# Patient Record
Sex: Male | Born: 1970 | Race: White | Hispanic: No | Marital: Married | Smoking: Never smoker
Health system: Southern US, Community
[De-identification: ages and names within clinical notes are randomized; demographics above are authoritative.]

## PROBLEM LIST (undated history)

## (undated) DIAGNOSIS — K900 Celiac disease: Secondary | ICD-10-CM

## (undated) DIAGNOSIS — I10 Essential (primary) hypertension: Secondary | ICD-10-CM

## (undated) DIAGNOSIS — E785 Hyperlipidemia, unspecified: Secondary | ICD-10-CM

## (undated) DIAGNOSIS — K311 Adult hypertrophic pyloric stenosis: Secondary | ICD-10-CM

## (undated) DIAGNOSIS — J9383 Other pneumothorax: Secondary | ICD-10-CM

## (undated) DIAGNOSIS — T7840XA Allergy, unspecified, initial encounter: Secondary | ICD-10-CM

## (undated) DIAGNOSIS — E739 Lactose intolerance, unspecified: Secondary | ICD-10-CM

## (undated) DIAGNOSIS — I341 Nonrheumatic mitral (valve) prolapse: Secondary | ICD-10-CM

## (undated) HISTORY — DX: Nonrheumatic mitral (valve) prolapse: I34.1

## (undated) HISTORY — DX: Other pneumothorax: J93.83

## (undated) HISTORY — DX: Allergy, unspecified, initial encounter: T78.40XA

## (undated) HISTORY — DX: Essential (primary) hypertension: I10

## (undated) HISTORY — PX: UPPER GASTROINTESTINAL ENDOSCOPY: SHX188

## (undated) HISTORY — DX: Adult hypertrophic pyloric stenosis: K31.1

## (undated) HISTORY — PX: BIOPSY STOMACH: PRO33

## (undated) HISTORY — DX: Celiac disease: K90.0

## (undated) HISTORY — PX: OTHER SURGICAL HISTORY: SHX169

## (undated) HISTORY — DX: Hyperlipidemia, unspecified: E78.5

## (undated) HISTORY — DX: Lactose intolerance, unspecified: E73.9

---

## 2000-04-23 ENCOUNTER — Ambulatory Visit (HOSPITAL_COMMUNITY): Admission: RE | Admit: 2000-04-23 | Discharge: 2000-04-23 | Payer: Self-pay | Admitting: Internal Medicine

## 2001-05-18 ENCOUNTER — Emergency Department (HOSPITAL_COMMUNITY): Admission: EM | Admit: 2001-05-18 | Discharge: 2001-05-19 | Payer: Self-pay

## 2004-10-16 ENCOUNTER — Emergency Department (HOSPITAL_COMMUNITY): Admission: EM | Admit: 2004-10-16 | Discharge: 2004-10-16 | Payer: Self-pay | Admitting: Emergency Medicine

## 2007-08-16 ENCOUNTER — Ambulatory Visit: Payer: Self-pay | Admitting: Cardiology

## 2007-08-23 ENCOUNTER — Ambulatory Visit: Payer: Self-pay

## 2010-12-13 NOTE — Assessment & Plan Note (Signed)
[facility]                            CARDIOLOGY OFFICE NOTE   NAME:Harry Molina, Harry Molina                    MRN:          [id number]  DATE:08/16/2007                            DOB:          [birth year]    REASON FOR REFERRAL:  Positive risk factors for coronary heart disease  and symptoms of dyspnea.   CLINICAL HISTORY:  Chriss Mannan is known to me through his father,  Karlo Goeden, who is a patient of mine.  He has had no prior history  of known heart disease.  He does have significant risk factors for  coronary heart disease with hypertension and hyperlipidemia which was  recently treated with Zocor beginning 2 months ago.  He has had no chest  pain, but he has had some intermittent shortness of breath with exertion  with various physical activities.  This has been intermittent and he is  not sure this has really increased in severity.  He was concerned  because of his markedly positive family history and this precipitated  the referral.   PAST MEDICAL HISTORY:  1. 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  2. Pyloric stenosis requiring surgery when he was a child.   FAMILY HISTORY:  Positive for heart disease in that his father had a  heart attack in his 32s.  He has a paternal grandfather and paternal  uncles who have had heart attacks and has a brother who has diabetes.  His brother is a physician and his uncle is a Development worker, community.   SOCIAL HISTORY:  He is a married and has two children with another one  on the way.  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   REVIEW OF SYSTEMS:  Positive for some epigastric discomfort and dyspnea.  Review of systems is otherwise negative.   PHYSICAL EXAMINATION:  VITAL SIGNS:  Blood pressure 119/70 with pulse 76  and regular.  NECK:  There was no venous distention.  The  carotid pulses were full  without bruits.  CHEST:  Clear without rales or rhonchi.  HEART:  Rhythm was regular.  I could hear no murmurs, gallops or clicks.  ABDOMEN:  Soft with normal bowel sounds.  There is no  hepatosplenomegaly.  Peripheral pulses are full and there is no  peripheral edema.   LABORATORY DATA AND X-RAY FINDINGS:  Electrocardiogram was normal.   He brought his liver panel with him and his total cholesterol was 231,  HDL was 46, LDL 165, pre-Zocor.  He also had a panel which showed that  his small particle number was very high and his LDL particle size was  quite small.   IMPRESSION:  1. Intermittent dyspnea of uncertain etiology.  2. Hyperlipidemia with small particle LDL size.  3. Hypertension treated.   RECOMMENDATIONS:  Matt's risk factors are quite strong with a very  strong positive family history of coronary heart disease and an abnormal  lipid profile and hypertension.  He also has symptoms  of dyspnea and I  think we should evaluate this with an exercise rest stress Myoview scan.  He has already been started on treatment for his hyperlipidemia and his  blood pressure appears to be under good control with the Benicar.  Dr.  Felipa Eth will follow up on his lipid profile.  If his scan is negative, the  we will reassure him that things are okay now and encourage him to  continue preventive measures with Dr. Felipa Eth.  We will be in touch with  him after we have the results.  We will have him return on a p.r.n.  basis.     Bruce Elvera Lennox Juanda Chance, MD, [facility]  Electronically Signed    BRB/MedQ  DD: 08/16/2007  DT: 08/16/2007  Job #: [id number]   cc:   Larina Earthly, M.D.

## 2011-01-25 ENCOUNTER — Telehealth: Payer: Self-pay | Admitting: *Deleted

## 2011-01-25 NOTE — Telephone Encounter (Signed)
Wrong chart

## 2015-04-13 ENCOUNTER — Ambulatory Visit (INDEPENDENT_AMBULATORY_CARE_PROVIDER_SITE_OTHER): Payer: 59

## 2015-04-13 ENCOUNTER — Ambulatory Visit (INDEPENDENT_AMBULATORY_CARE_PROVIDER_SITE_OTHER): Payer: 59 | Admitting: Family Medicine

## 2015-04-13 VITALS — BP 110/64 | HR 73 | Temp 98.1°F | Resp 18 | Ht 72.75 in | Wt 182.6 lb

## 2015-04-13 DIAGNOSIS — M79671 Pain in right foot: Secondary | ICD-10-CM

## 2015-04-13 DIAGNOSIS — S9031XA Contusion of right foot, initial encounter: Secondary | ICD-10-CM | POA: Diagnosis not present

## 2015-04-13 MED ORDER — MELOXICAM 15 MG PO TABS: 7.5000 mg | ORAL_TABLET | Freq: Every day | ORAL | Status: DC

## 2015-04-13 NOTE — Progress Notes (Signed)
    MRN: [id number] DOB: August 22, 1970  Subjective:   Harry Molina is a 44 y.o. male presenting for chief complaint of Foot Injury  Reports 1.5 week history of bilateral heel pain, R>L. Pain started after jumping into a creek from about 10-12 feet high into creek that was not very deep. Since then, he has had persistent heel pain, walks with a limp or on his tiptoes. He has not tried any medications for relief, has not been able to rest and walks a lot for his work. Denies fever, bruising, redness, hearing popping or tearing noise is, swelling. Patient's primary concern is to make sure that he does not have a heel fracture. Denies any other aggravating or relieving factors, no other questions or concerns.  Harry Molina has a current medication list which includes the following prescription(s): olmesartan, rosuvastatin, and meloxicam. Also has No Known Allergies.  Harry Molina  has a past medical history of Hyperlipidemia and Hypertension. Also  has no past surgical history on file.  Objective:   Vitals: BP 110/64 mmHg  Pulse 73  Temp(Src) 98.1 F (36.7 C) (Oral)  Resp 18  Ht 6' 0.75" (1.848 m)  Wt 182 lb 9.6 oz (82.827 kg)  BMI 24.25 kg/m2  SpO2 99%  Physical Exam  Constitutional: He is oriented to person, place, and time. He appears well-developed and well-nourished.  Cardiovascular: Normal rate.   Pulmonary/Chest: Effort normal.  Musculoskeletal:       Right foot: There is normal range of motion, no tenderness, no bony tenderness, no swelling, normal capillary refill, no crepitus, no deformity and no laceration.  No ecchymosis.  Neurological: He is alert and oriented to person, place, and time. He has normal reflexes.  Skin: Skin is warm and dry. No rash noted. No erythema. No pallor.  Psychiatric: He has a normal mood and affect.   [facility] reading (PRIMARY) by  Dr. Neva Seat and PA-Carmel Garfield. Right foot - normal.  Assessment and Plan :   1. Right foot pain 2. Heel pain, right 3. Contusion  of heel, right, initial encounter - Patient likely has right heel contusion, recommended supportive care, start meloxicam and use ice bath after work. Advised patient that he should pick up a heel support since he is unable to rest and walks a lot for his job. If the patient has no improvement in next 2 weeks he is to return to clinic and we'll consider referral to or through or physical therapy.  Wallis Bamberg, PA-C [facility] [phone] 04/13/2015 3:11 PM

## 2015-04-13 NOTE — Patient Instructions (Addendum)
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

## 2015-04-14 NOTE — Progress Notes (Signed)
Xray read and patient discussed with Mr. Mani. Agree with assessment and plan of care per his note.   

## 2015-06-30 ENCOUNTER — Ambulatory Visit (INDEPENDENT_AMBULATORY_CARE_PROVIDER_SITE_OTHER): Payer: 59 | Admitting: Neurology

## 2015-06-30 ENCOUNTER — Encounter: Payer: Self-pay | Admitting: Neurology

## 2015-06-30 VITALS — BP 122/76 | HR 72 | Resp 18 | Ht 73.0 in | Wt 188.0 lb

## 2015-06-30 DIAGNOSIS — R0683 Snoring: Secondary | ICD-10-CM | POA: Diagnosis not present

## 2015-06-30 DIAGNOSIS — G2581 Restless legs syndrome: Secondary | ICD-10-CM

## 2015-06-30 DIAGNOSIS — R519 Headache, unspecified: Secondary | ICD-10-CM

## 2015-06-30 DIAGNOSIS — G4761 Periodic limb movement disorder: Secondary | ICD-10-CM

## 2015-06-30 DIAGNOSIS — G4719 Other hypersomnia: Secondary | ICD-10-CM | POA: Diagnosis not present

## 2015-06-30 DIAGNOSIS — R51 Headache: Secondary | ICD-10-CM | POA: Diagnosis not present

## 2015-06-30 DIAGNOSIS — R0681 Apnea, not elsewhere classified: Secondary | ICD-10-CM

## 2015-06-30 NOTE — Progress Notes (Signed)
Subjective:    Patient ID: Harry Molina is a 44 y.o. male.  HPI     Harry Foley, MD, PhD [facility] [address] [address] [place], [place] [address]  Dear Dr. Felipa Eth,   I saw your patient, Harry Molina, upon your kind request in my neurologic clinic today for initial consultation of his sleep disorder, in particular, concern for underlying obstructive sleep apnea. The patient is unaccompanied today. As you know, Harry Molina is a 44 year old right-handed gentleman with an underlying medical history of hypertension, hyperlipidemia, lactose intolerance, childhood diagnosis of celiac disease, mitral valve prolapse, and overweight state, who reports a long-standing history of snoring, excessive daytime somnolence as well as witnessed breathing pauses while asleep per wife's report. I reviewed your office note from 06/03/2015, which you kindly included. He works full-time. He lives at home with his wife and 3 children, ages 45, 51 and 61. He does not smoke. He drinks alcohol occasionally. He drinks caffeine in the form of sodas, 3-4 cans per day. His bedtime is usually past midnight. His rise time is between 6:30 and 7:45 AM. He does not wake up rested. He endorses occasional restless leg symptoms and restless movements of his legs. He has occasional dull morning headaches or heaviness in his head. He has occasional feeling of foggy headedness. He denies any significant nocturia. His Epworth sleepiness score is 13 out of 24 today, his fatigue score is 34 out of 63. He is not aware of any family history of OSA but his father snores heavily. There is history on his father's side of early cardiac disease. The patient has a previous history of spontaneous hemothorax when he was in college. His weight has been stable. He works full-time and is the president/CEO of a Physiological scientist. Of note, he grinds his teeth at night. He has also talked to his dentist about  potentially getting a bite guard versus a dental appliance for sleep apnea. He has been encouraged by his dentist to seek a sleep study as well.  His Past Medical History Is Significant For: Past Medical History  Diagnosis Date  . Hyperlipidemia   . Hypertension   . Lactose intolerance   . Celiac sprue     Resolved at age 6-13  . Spontaneous pneumothorax   . Pyloric stenosis   . Mitral valve prolapse     His Past Surgical History Is Significant For: Past Surgical History  Procedure Laterality Date  . Pyloric stenosis      His Family History Is Significant For: Family History  Problem Relation Age of Onset  . Heart disease Father   . Hyperlipidemia Father   . Heart attack Father     His Social History Is Significant For: Social History   Social History  . Marital Status: Married    Spouse Name: N/A  . Number of Children: 3  . Years of Education: College   Occupational History  . [employer]     Social History Main Topics  . Smoking status: Never Smoker   . Smokeless tobacco: None  . Alcohol Use: 0.0 oz/week    0 Standard drinks or equivalent per week  . Drug Use: No  . Sexual Activity: Not Asked   Other Topics Concern  . None   Social History Narrative   Drinks about 3-4 diet cokes a day     His Allergies Are:  No Known Allergies:   His Current Medications Are:  Outpatient Encounter Prescriptions  as of 06/30/2015  Medication Sig  . olmesartan (BENICAR) 20 MG tablet Take 10 mg by mouth daily.  . rosuvastatin (CRESTOR) 20 MG tablet Take 20 mg by mouth daily.  . [DISCONTINUED] meloxicam (MOBIC) 15 MG tablet Take 0.5-1 tablets (7.5-15 mg total) by mouth daily.  . [DISCONTINUED] olmesartan (BENICAR) 20 MG tablet Take 20 mg by mouth daily.   No facility-administered encounter medications on file as of 06/30/2015.  :  Review of Systems:  Out of a complete 14 point review of systems, all are reviewed and negative with the exception of these symptoms as  listed below:   Review of Systems  Neurological:       Has trouble falling asleep, snoring, witnessed apnea, teeth wearing, wakes up feeling tired, does not feel like himself during the day.    Epworth Sleepiness Scale 0= would never doze 1= slight chance of dozing 2= moderate chance of dozing 3= high chance of dozing  Sitting and reading:3 Watching TV:0 Sitting inactive in a public place (ex. Theater or meeting):2 As a passenger in a car for an hour without a break:2 Lying down to rest in the afternoon:3 Sitting and talking to someone:0 Sitting quietly after lunch (no alcohol):3 In a car, while stopped in traffic:0 Total:13 Objective:  Neurologic Exam  Physical Exam Physical Examination:   Filed Vitals:   06/30/15 1343  BP: 122/76  Pulse: 72  Resp: 18   General Examination: The patient is a very pleasant 44 y.o. male in no acute distress. He appears well-developed and well-nourished and well groomed.   HEENT: Normocephalic, atraumatic, pupils are equal, round and reactive to light and accommodation. Funduscopic exam is normal with sharp disc margins noted. Extraocular tracking is good without limitation to gaze excursion or nystagmus noted. Normal smooth pursuit is noted. Hearing is grossly intact. Face is symmetric with normal facial animation and normal facial sensation. Speech is clear with no dysarthria noted. There is no hypophonia. There is no lip, neck/head, jaw or voice tremor. Neck is supple with full range of passive and active motion. There are no carotid bruits on auscultation. Oropharynx exam reveals: mild mouth dryness, good dental hygiene and mild airway crowding, due to narrow airway entry and tonsils in place. Mallampati is class II. Tongue protrudes centrally and palate elevates symmetrically. Tonsils are 1+ in size. Neck size is 15.5 inches. He has a Mild overbite. Nasal inspection reveals no significant nasal mucosal bogginess or redness and no septal  deviation.   Chest: Clear to auscultation without wheezing, rhonchi or crackles noted.  Heart: S1+S2+0, regular and normal without murmurs, rubs or gallops noted.   Abdomen: Soft, non-tender and non-distended with normal bowel sounds appreciated on auscultation.  Extremities: There is no pitting edema in the distal lower extremities bilaterally. Pedal pulses are intact.  Skin: Warm and dry without trophic changes noted. There are no varicose veins.  Musculoskeletal: exam reveals no obvious joint deformities, tenderness or joint swelling or erythema.   Neurologically:  Mental status: The patient is awake, alert and oriented in all 4 spheres. His immediate and remote memory, attention, language skills and fund of knowledge are appropriate. There is no evidence of aphasia, agnosia, apraxia or anomia. Speech is clear with normal prosody and enunciation. Thought process is linear. Mood is normal and affect is normal.  Cranial nerves II - XII are as described above under HEENT exam. In addition: shoulder shrug is normal with equal shoulder height noted. Motor exam: Normal bulk, strength and tone  is noted. There is no drift, tremor or rebound. Romberg is negative. Reflexes are 2+ throughout. Babinski: Toes are flexor bilaterally. Fine motor skills and coordination: intact with normal finger taps, normal hand movements, normal rapid alternating patting, normal foot taps and normal foot agility.  Cerebellar testing: No dysmetria or intention tremor on finger to nose testing. Heel to shin is unremarkable bilaterally. There is no truncal or gait ataxia.  Sensory exam: intact to light touch, and temperature sense in the upper and lower extremities.  Gait, station and balance: He stands easily. No veering to one side is noted. No leaning to one side is noted. Posture is age-appropriate and stance is narrow based. Gait shows normal stride length and normal pace. No problems turning are noted. He turns en bloc.  Tandem walk is unremarkable.                Assessment and Plan:   In summary, Valetta FullerMatthew O'Connell is a very pleasant 44 y.o.-year old male  with an underlying medical history of hypertension, hyperlipidemia, lactose intolerance, childhood diagnosis of celiac disease, mitral valve prolapse, and overweight state, whose history and physical exam are in keeping with obstructive sleep apnea (OSA). He also endorses restless legs symptoms and some leg movements in sleep. I had a long chat with the patient about my findings and the diagnosis of OSA, its prognosis and treatment options. We talked about medical treatments, surgical interventions and non-pharmacological approaches. I explained in particular the risks and ramifications of untreated moderate to severe OSA, especially with respect to developing cardiovascular disease down the Road, including congestive heart failure, difficult to treat hypertension, cardiac arrhythmias, or stroke. Even type 2 diabetes has, in part, been linked to untreated OSA. Symptoms of untreated OSA include daytime sleepiness, memory problems, mood irritability and mood disorder such as depression and anxiety, lack of energy, as well as recurrent headaches, especially morning headaches. We talked about trying to maintain a healthy lifestyle in general, as well as the importance of weight control. I encouraged the patient to eat healthy, exercise daily and keep well hydrated, to keep a scheduled bedtime and wake time routine, to not skip any meals and eat healthy snacks in between meals. I advised the patient not to drive when feeling sleepy. I recommended the following at this time: sleep study with potential positive airway pressure titration. (We will score hypopneas at 4% and split the sleep study into diagnostic and treatment portion, if the estimated. 2 hour AHI is >20/h).   I explained the sleep test procedure to the patient and also outlined possible surgical and non-surgical  treatment options of OSA, including the use of a custom-made dental device (which would require a referral to a specialist dentist or oral surgeon), upper airway surgical options, such as pillar implants, radiofrequency surgery, tongue base surgery, and UPPP (which would involve a referral to an ENT surgeon). Rarely, jaw surgery such as mandibular advancement may be considered.  I also explained the CPAP treatment option to the patient, who indicated that he would be willing to try CPAP if the need arises. I explained the importance of being compliant with PAP treatment, not only for insurance purposes but primarily to improve His symptoms, and for the patient's long term health benefit, including to reduce His cardiovascular risks. I answered all his questions today and the patient was in agreement. I would like to see him back after the sleep study is completed and encouraged him to call with any interim questions,  concerns, problems or updates.   Thank you very much for allowing me to participate in the care of this nice patient. If I can be of any further assistance to you please do not hesitate to call me at [phone].  Sincerely,   Star Age, MD, PhD

## 2015-06-30 NOTE — Patient Instructions (Signed)

## 2015-07-21 ENCOUNTER — Telehealth: Payer: Self-pay | Admitting: Neurology

## 2015-07-21 DIAGNOSIS — G2581 Restless legs syndrome: Secondary | ICD-10-CM

## 2015-07-21 DIAGNOSIS — R0681 Apnea, not elsewhere classified: Secondary | ICD-10-CM

## 2015-07-21 DIAGNOSIS — G4719 Other hypersomnia: Secondary | ICD-10-CM

## 2015-07-21 DIAGNOSIS — R0683 Snoring: Secondary | ICD-10-CM

## 2015-07-21 NOTE — Telephone Encounter (Signed)
This patient's insurance denied an attended sleep study. I will order home sleep test.  

## 2015-07-21 NOTE — Telephone Encounter (Signed)
Split Sleep Study denied HST approved.  Can I get an order for HST?

## 2015-07-27 ENCOUNTER — Encounter: Payer: Self-pay | Admitting: Neurology

## 2015-08-05 ENCOUNTER — Encounter (INDEPENDENT_AMBULATORY_CARE_PROVIDER_SITE_OTHER): Payer: 59 | Admitting: Neurology

## 2015-08-05 DIAGNOSIS — G2581 Restless legs syndrome: Secondary | ICD-10-CM

## 2015-08-05 DIAGNOSIS — R0681 Apnea, not elsewhere classified: Secondary | ICD-10-CM

## 2015-08-05 DIAGNOSIS — G4719 Other hypersomnia: Secondary | ICD-10-CM

## 2015-08-05 DIAGNOSIS — G4710 Hypersomnia, unspecified: Secondary | ICD-10-CM

## 2015-08-05 DIAGNOSIS — R0683 Snoring: Secondary | ICD-10-CM

## 2015-08-11 ENCOUNTER — Telehealth: Payer: Self-pay | Admitting: Neurology

## 2015-08-11 NOTE — Telephone Encounter (Signed)
I spoke to patient and he is aware of results and recommendations. He would like to f/u with PCP. I will send report to PCP. I will also mail the patient a reminder of good sleep hygiene techniques.

## 2015-08-11 NOTE — Telephone Encounter (Signed)
Patient referred by Dr. Felipa EthAvva, seen by me on 06/30/15, HST on 08/06/15.   Please call and notify the patient that the recent home sleep test did not show any significant obstructive sleep apnea and CPAP therapy is not warranted. Patient can follow up with the referring provider. I can see patient in FU if desired, for example, if he has significant RLS symptoms.  A copy of the report will be sent to the patient, the PCP and referring MD, if other than PCP.   Please remind patient to try to maintain good sleep hygiene, which means: Keep a regular sleep and wake schedule, try not to exercise or have a meal within 2 hours of your bedtime, try to keep your bedroom conducive for sleep, that is, cool and dark, without light distractors such as an illuminated alarm clock, and refrain from watching TV right before sleep or in the middle of the night and do not keep the TV or radio on during the night. Also, try not to use or play on electronic devices at bedtime, such as your cell phone, tablet PC or laptop. If you like to read at bedtime on an electronic device, try to dim the background light as much as possible. Do not eat in the middle of the night. Allow for enough sleep time, 7 1/2 to 8 1/2 hours is recommended.  Once you have spoken to patient, you can close this encounter.   Huston FoleySaima Tyger Oka, MD, PhD [facility]

## 2015-09-03 ENCOUNTER — Encounter (HOSPITAL_COMMUNITY): Payer: Self-pay | Admitting: *Deleted

## 2015-09-04 ENCOUNTER — Ambulatory Visit (HOSPITAL_COMMUNITY): Payer: 59 | Admitting: Certified Registered Nurse Anesthetist

## 2015-09-04 ENCOUNTER — Ambulatory Visit (HOSPITAL_COMMUNITY)
Admission: RE | Admit: 2015-09-04 | Discharge: 2015-09-04 | Disposition: A | Discharge status: Home / Self Care | Payer: 59 | Source: Ambulatory Visit | Attending: Orthopedic Surgery | Admitting: Orthopedic Surgery

## 2015-09-04 ENCOUNTER — Encounter (HOSPITAL_COMMUNITY): Payer: Self-pay | Admitting: *Deleted

## 2015-09-04 ENCOUNTER — Encounter (HOSPITAL_COMMUNITY)
Admission: RE | Disposition: A | Discharge status: Home / Self Care | Payer: Self-pay | Source: Ambulatory Visit | Attending: Orthopedic Surgery

## 2015-09-04 DIAGNOSIS — I10 Essential (primary) hypertension: Secondary | ICD-10-CM | POA: Diagnosis not present

## 2015-09-04 DIAGNOSIS — X58XXXA Exposure to other specified factors, initial encounter: Secondary | ICD-10-CM | POA: Diagnosis not present

## 2015-09-04 DIAGNOSIS — E669 Obesity, unspecified: Secondary | ICD-10-CM | POA: Diagnosis not present

## 2015-09-04 DIAGNOSIS — F17290 Nicotine dependence, other tobacco product, uncomplicated: Secondary | ICD-10-CM | POA: Insufficient documentation

## 2015-09-04 DIAGNOSIS — S62616A Displaced fracture of proximal phalanx of right little finger, initial encounter for closed fracture: Secondary | ICD-10-CM | POA: Insufficient documentation

## 2015-09-04 DIAGNOSIS — E785 Hyperlipidemia, unspecified: Secondary | ICD-10-CM | POA: Diagnosis not present

## 2015-09-04 DIAGNOSIS — Z6825 Body mass index (BMI) 25.0-25.9, adult: Secondary | ICD-10-CM | POA: Insufficient documentation

## 2015-09-04 HISTORY — PX: OPEN REDUCTION INTERNAL FIXATION (ORIF) PROXIMAL PHALANX: SHX6235

## 2015-09-04 LAB — BASIC METABOLIC PANEL
ANION GAP: 10 (ref 5–15)
BUN: 10 mg/dL (ref 6–20)
CHLORIDE: 105 mmol/L (ref 101–111)
CO2: 27 mmol/L (ref 22–32)
Calcium: 9.5 mg/dL (ref 8.9–10.3)
Creatinine, Ser: 1.01 mg/dL (ref 0.61–1.24)
GFR calc non Af Amer: >60 mL/min (ref >60)
Glucose, Bld: 95 mg/dL (ref 65–99)
POTASSIUM: 4.4 mmol/L (ref 3.5–5.1)
SODIUM: 142 mmol/L (ref 135–145)

## 2015-09-04 SURGERY — OPEN REDUCTION INTERNAL FIXATION (ORIF) PROXIMAL PHALANX
Anesthesia: Monitor Anesthesia Care | Site: Finger | Laterality: Right

## 2015-09-04 MED ORDER — SUCCINYLCHOLINE CHLORIDE 20 MG/ML IJ SOLN
INTRAMUSCULAR | Status: AC
  Filled 2015-09-04: qty 2

## 2015-09-04 MED ORDER — BUPIVACAINE-EPINEPHRINE (PF) 0.5% -1:200000 IJ SOLN
INTRAMUSCULAR | Status: DC | PRN
  Administered 2015-09-04: 30 mL via PERINEURAL

## 2015-09-04 MED ORDER — PROPOFOL 10 MG/ML IV BOLUS
INTRAVENOUS | Status: AC
  Filled 2015-09-04: qty 40

## 2015-09-04 MED ORDER — ONDANSETRON HCL 4 MG/2ML IJ SOLN
INTRAMUSCULAR | Status: AC
Start: 2015-09-04 — End: 2015-09-04
  Filled 2015-09-04: qty 2

## 2015-09-04 MED ORDER — HYDROCODONE-ACETAMINOPHEN 10-325 MG PO TABS: 1.0000 | ORAL_TABLET | ORAL | Status: DC | PRN

## 2015-09-04 MED ORDER — MIDAZOLAM HCL 2 MG/2ML IJ SOLN
INTRAMUSCULAR | Status: AC
  Administered 2015-09-04: 2 mg via INTRAVENOUS
  Filled 2015-09-04: qty 2

## 2015-09-04 MED ORDER — LACTATED RINGERS IV SOLN
INTRAVENOUS | Status: DC | PRN
  Administered 2015-09-04: 15:00:00 via INTRAVENOUS

## 2015-09-04 MED ORDER — CEFAZOLIN SODIUM-DEXTROSE 2-3 GM-% IV SOLR
INTRAVENOUS | Status: AC
  Filled 2015-09-04: qty 50

## 2015-09-04 MED ORDER — FENTANYL CITRATE (PF) 100 MCG/2ML IJ SOLN
INTRAMUSCULAR | Status: AC
  Administered 2015-09-04: 100 ug via INTRAVENOUS
  Filled 2015-09-04: qty 2

## 2015-09-04 MED ORDER — PROPOFOL 10 MG/ML IV BOLUS
INTRAVENOUS | Status: AC
Start: 2015-09-04 — End: 2015-09-04
  Filled 2015-09-04: qty 20

## 2015-09-04 MED ORDER — MIDAZOLAM HCL 2 MG/2ML IJ SOLN
INTRAMUSCULAR | Status: AC
Start: 2015-09-04 — End: 2015-09-04
  Filled 2015-09-04: qty 2

## 2015-09-04 MED ORDER — FENTANYL CITRATE (PF) 100 MCG/2ML IJ SOLN
INTRAMUSCULAR | Status: DC | PRN
  Administered 2015-09-04: 50 ug via INTRAVENOUS
  Administered 2015-09-04: 25 ug via INTRAVENOUS
  Administered 2015-09-04 (×3): 50 ug via INTRAVENOUS
  Administered 2015-09-04: 25 ug via INTRAVENOUS
  Administered 2015-09-04 (×5): 50 ug via INTRAVENOUS

## 2015-09-04 MED ORDER — FENTANYL CITRATE (PF) 250 MCG/5ML IJ SOLN
INTRAMUSCULAR | Status: AC
Start: 2015-09-04 — End: 2015-09-04
  Filled 2015-09-04: qty 5

## 2015-09-04 MED ORDER — LACTATED RINGERS IV SOLN
INTRAVENOUS | Status: DC
  Administered 2015-09-04: 13:00:00 via INTRAVENOUS

## 2015-09-04 MED ORDER — PROPOFOL 10 MG/ML IV BOLUS
INTRAVENOUS | Status: DC | PRN
  Administered 2015-09-04 (×3): 20 mg via INTRAVENOUS
  Administered 2015-09-04: 10 mg via INTRAVENOUS
  Administered 2015-09-04 (×2): 20 mg via INTRAVENOUS
  Administered 2015-09-04: 10 mg via INTRAVENOUS
  Administered 2015-09-04 (×5): 20 mg via INTRAVENOUS
  Administered 2015-09-04: 10 mg via INTRAVENOUS
  Administered 2015-09-04 (×3): 20 mg via INTRAVENOUS

## 2015-09-04 MED ORDER — FENTANYL CITRATE (PF) 250 MCG/5ML IJ SOLN
INTRAMUSCULAR | Status: AC
  Filled 2015-09-04: qty 5

## 2015-09-04 MED ORDER — ARTIFICIAL TEARS OP OINT
TOPICAL_OINTMENT | OPHTHALMIC | Status: AC
  Filled 2015-09-04: qty 3.5

## 2015-09-04 MED ORDER — CEFAZOLIN SODIUM-DEXTROSE 2-3 GM-% IV SOLR
2.0000 g | Freq: Once | INTRAVENOUS | Status: AC
  Administered 2015-09-04: 2 g via INTRAVENOUS

## 2015-09-04 MED ORDER — ETOMIDATE 2 MG/ML IV SOLN
INTRAVENOUS | Status: AC
  Filled 2015-09-04: qty 10

## 2015-09-04 MED ORDER — FENTANYL CITRATE (PF) 100 MCG/2ML IJ SOLN
100.0000 ug | Freq: Once | INTRAMUSCULAR | Status: AC
  Administered 2015-09-04: 100 ug via INTRAVENOUS

## 2015-09-04 MED ORDER — LIDOCAINE HCL (CARDIAC) 20 MG/ML IV SOLN
INTRAVENOUS | Status: AC
  Filled 2015-09-04: qty 10

## 2015-09-04 MED ORDER — 0.9 % SODIUM CHLORIDE (POUR BTL) OPTIME
TOPICAL | Status: DC | PRN
  Administered 2015-09-04: 1000 mL

## 2015-09-04 MED ORDER — ROCURONIUM BROMIDE 50 MG/5ML IV SOLN
INTRAVENOUS | Status: AC
  Filled 2015-09-04: qty 1

## 2015-09-04 MED ORDER — LIDOCAINE HCL (CARDIAC) 20 MG/ML IV SOLN
INTRAVENOUS | Status: DC | PRN
  Administered 2015-09-04: 30 mg via INTRAVENOUS

## 2015-09-04 MED ORDER — MIDAZOLAM HCL 2 MG/2ML IJ SOLN
2.0000 mg | Freq: Once | INTRAMUSCULAR | Status: AC
  Administered 2015-09-04: 2 mg via INTRAVENOUS

## 2015-09-04 MED ORDER — CEPHALEXIN 500 MG PO CAPS
500.0000 mg | ORAL_CAPSULE | Freq: Four times a day (QID) | ORAL | Status: DC
Start: 2015-09-04 — End: 2016-01-07

## 2015-09-04 MED ORDER — MIDAZOLAM HCL 5 MG/5ML IJ SOLN
INTRAMUSCULAR | Status: DC | PRN
  Administered 2015-09-04: 2 mg via INTRAVENOUS

## 2015-09-04 SURGICAL SUPPLY — 65 items
BANDAGE ELASTIC 3 VELCRO ST LF (GAUZE/BANDAGES/DRESSINGS) ×1 IMPLANT
BANDAGE ELASTIC 4 VELCRO ST LF (GAUZE/BANDAGES/DRESSINGS) ×2 IMPLANT
BIT DRILL 1.1 (BIT) ×2
BIT DRILL 60X20X1.1XQC TMX (BIT) IMPLANT
BIT DRL 60X20X1.1XQC TMX (BIT) ×1
BLADE SURG ROTATE 9660 (MISCELLANEOUS) IMPLANT
BNDG CMPR 9X4 STRL LF SNTH (GAUZE/BANDAGES/DRESSINGS) ×1
BNDG ESMARK 4X9 LF (GAUZE/BANDAGES/DRESSINGS) ×2 IMPLANT
BNDG GAUZE ELAST 4 BULKY (GAUZE/BANDAGES/DRESSINGS) ×4 IMPLANT
CORDS BIPOLAR (ELECTRODE) ×3 IMPLANT
COVER SURGICAL LIGHT HANDLE (MISCELLANEOUS) ×2 IMPLANT
CUFF TOURNIQUET SINGLE 18IN (TOURNIQUET CUFF) ×2 IMPLANT
CUFF TOURNIQUET SINGLE 24IN (TOURNIQUET CUFF) IMPLANT
DRAIN TLS ROUND 10FR (DRAIN) IMPLANT
DRAPE OEC MINIVIEW 54X84 (DRAPES) ×1 IMPLANT
DRAPE SURG 17X23 STRL (DRAPES) ×2 IMPLANT
DRSG ADAPTIC 3X8 NADH LF (GAUZE/BANDAGES/DRESSINGS) ×1 IMPLANT
GAUZE SPONGE 4X4 12PLY STRL (GAUZE/BANDAGES/DRESSINGS) ×1 IMPLANT
GAUZE XEROFORM 1X8 LF (GAUZE/BANDAGES/DRESSINGS) ×2 IMPLANT
GLOVE BIOGEL M STRL SZ7.5 (GLOVE) ×2 IMPLANT
GLOVE SS BIOGEL STRL SZ 8 (GLOVE) ×1 IMPLANT
GLOVE SUPERSENSE BIOGEL SZ 8 (GLOVE) ×1
GOWN STRL REUS W/ TWL LRG LVL3 (GOWN DISPOSABLE) ×3 IMPLANT
GOWN STRL REUS W/ TWL XL LVL3 (GOWN DISPOSABLE) ×3 IMPLANT
GOWN STRL REUS W/TWL LRG LVL3 (GOWN DISPOSABLE) ×6
GOWN STRL REUS W/TWL XL LVL3 (GOWN DISPOSABLE) ×2
KIT BASIN OR (CUSTOM PROCEDURE TRAY) ×2 IMPLANT
KIT ROOM TURNOVER OR (KITS) ×2 IMPLANT
MANIFOLD NEPTUNE II (INSTRUMENTS) ×2 IMPLANT
NEEDLE 22X1 1/2 (OR ONLY) (NEEDLE) IMPLANT
NS IRRIG 1000ML POUR BTL (IV SOLUTION) ×2 IMPLANT
PACK ORTHO EXTREMITY (CUSTOM PROCEDURE TRAY) ×2 IMPLANT
PAD ARMBOARD 7.5X6 YLW CONV (MISCELLANEOUS) ×4 IMPLANT
PAD CAST 3X4 CTTN HI CHSV (CAST SUPPLIES) ×1 IMPLANT
PAD CAST 4YDX4 CTTN HI CHSV (CAST SUPPLIES) ×1 IMPLANT
PADDING CAST COTTON 3X4 STRL (CAST SUPPLIES) ×2
PADDING CAST COTTON 4X4 STRL (CAST SUPPLIES) ×2
PLATE BONE CONT 1.5MM LOCKING (Plate) ×1 IMPLANT
SCREW 1.5X15MM (Screw) ×1 IMPLANT
SCREW 1.5X8 (Screw) ×1 IMPLANT
SCREW CORTICAL 1.5X9MM WRIST (Screw) ×1 IMPLANT
SCREW NL 1.5X12 (Screw) ×1 IMPLANT
SCREW NL 1.5X13 (Screw) ×1 IMPLANT
SCREW NONIOC 1.5 10M (Screw) ×1 IMPLANT
SCREW NONIOC 1.5 14M (Screw) ×1 IMPLANT
SCREW NONIOC 1.5 16M (Screw) ×2 IMPLANT
SPONGE GAUZE 4X4 12PLY STER LF (GAUZE/BANDAGES/DRESSINGS) ×1 IMPLANT
SPONGE LAP 4X18 X RAY DECT (DISPOSABLE) IMPLANT
SUCTION FRAZIER TIP 8 FR DISP (SUCTIONS) ×1
SUCTION TUBE FRAZIER 8FR DISP (SUCTIONS) IMPLANT
SUT CHROMIC 6 0 PS 4 (SUTURE) ×1 IMPLANT
SUT FIBERWIRE 4-0 18 DIAM BLUE (SUTURE) ×2
SUT MNCRL AB 4-0 PS2 18 (SUTURE) ×2 IMPLANT
SUT PROLENE 3 0 PS 2 (SUTURE) ×1 IMPLANT
SUT PROLENE 4 0 PS 2 18 (SUTURE) ×1 IMPLANT
SUT PROLENE 4 0 TF (SUTURE) ×1 IMPLANT
SUT VIC AB 3-0 FS2 27 (SUTURE) ×1 IMPLANT
SUTURE FIBERWR 4-0 18 DIA BLUE (SUTURE) IMPLANT
SYR CONTROL 10ML LL (SYRINGE) IMPLANT
SYSTEM CHEST DRAIN TLS 7FR (DRAIN) IMPLANT
TOWEL OR 17X24 6PK STRL BLUE (TOWEL DISPOSABLE) ×2 IMPLANT
TOWEL OR 17X26 10 PK STRL BLUE (TOWEL DISPOSABLE) ×2 IMPLANT
TUBE CONNECTING 12X1/4 (SUCTIONS) ×2 IMPLANT
TUBE EVACUATION TLS (MISCELLANEOUS) ×1 IMPLANT
WATER STERILE IRR 1000ML POUR (IV SOLUTION) ×2 IMPLANT

## 2015-09-04 NOTE — Discharge Instructions (Signed)
Please makes her to elevate your hand. This is the most important aspect of the first 24 hours after your surgery  Please call for any problems. Our office will call for your follow-up.  You may move your fingers gently within the splint however the splint will limits her motion substantially  Keep bandage clean and dry.  Call for any problems.  No smoking.  Criteria for driving a car: you should be off your pain medicine for 7-8 hours, able to drive one handed(confident), thinking clearly and feeling able in your judgement to drive. Continue elevation as it will decrease swelling.  If instructed by MD move your fingers within the confines of the bandage/splint.  Use ice if instructed by your MD. Call immediately for any sudden loss of feeling in your hand/arm or change in functional abilities of the extremity.We recommend that you to take vitamin C 1000 mg a day to promote healing. We also recommend that if you require  pain medicine that you take a stool softener to prevent constipation as most pain medicines will have constipation side effects. We recommend either Peri-Colace or Senokot and recommend that you also consider adding MiraLAX as well to prevent the constipation affects from pain medicine if you are required to use them. These medicines are over the counter and may be purchased at a local pharmacy. A cup of yogurt and a probiotic can also be helpful during the recovery process as the medicines can disrupt your intestinal environment.

## 2015-09-04 NOTE — Anesthesia Postprocedure Evaluation (Signed)
Anesthesia Post Note  Patient: Harry Molina  Procedure(s) Performed: Procedure(s) (LRB): OPEN REDUCTION INTERNAL FIXATION (ORIF) RIGHT SMALL FINGER PROXIMAL PHALANX (Right)  Patient location during evaluation: PACU Anesthesia Type: MAC Level of consciousness: awake and alert Pain management: pain level controlled Vital Signs Assessment: post-procedure vital signs reviewed and stable Respiratory status: spontaneous breathing, nonlabored ventilation, respiratory function stable and patient connected to nasal cannula oxygen Cardiovascular status: stable and blood pressure returned to baseline Anesthetic complications: no    Last Vitals:  Filed Vitals:   09/04/15 1745 09/04/15 1800  BP: 136/86 128/91  Pulse: 65 62  Temp:  36.7 C  Resp: 14 15    Last Pain: There were no vitals filed for this visit.               Anaiyah Anglemyer S

## 2015-09-04 NOTE — Transfer of Care (Signed)
Immediate Anesthesia Transfer of Care Note  Patient: Harry Molina  Procedure(s) Performed: Procedure(s): OPEN REDUCTION INTERNAL FIXATION (ORIF) RIGHT SMALL FINGER PROXIMAL PHALANX (Right)  Patient Location: PACU  Anesthesia Type:MAC combined with regional for post-op pain  Level of Consciousness: awake  Airway & Oxygen Therapy: Patient Spontanous Breathing  Post-op Assessment: Report given to RN and Post -op Vital signs reviewed and stable  Post vital signs: Reviewed and stable  Last Vitals:  Filed Vitals:   09/04/15 1435 09/04/15 1436  BP:  147/69  Pulse: 84 78  Temp:    Resp: 18 14    Complications: No apparent anesthesia complications

## 2015-09-04 NOTE — H&P (Signed)
Harry Molina is an 45 y.o. male.   Chief Complaint: Right displaced small finger fracture plan ORIF HPI: Patient presents for open reduction internal fixation right small finger after an injury with a dodgeball game.  He denies other complaints. Patient presents for evaluation and treatment of the of their upper extremity predicament. The patient denies neck, back, chest or  abdominal pain. The patient notes that they have no lower extremity problems. The patients primary complaint is noted. We are planning surgical care pathway for the upper extremity.  Past Medical History  Diagnosis Date  . Hyperlipidemia   . Hypertension   . Lactose intolerance   . Celiac sprue     Resolved at age 26-13  . Spontaneous pneumothorax     unsure side  . Pyloric stenosis   . Mitral valve prolapse     Past Surgical History  Procedure Laterality Date  . Pyloric stenosis    . Biopsy stomach      Family History  Problem Relation Age of Onset  . Heart disease Father   . Hyperlipidemia Father   . Heart attack Father    Social History:  reports that he has never smoked. His smokeless tobacco use includes Snuff. He reports that he drinks alcohol. He reports that he does not use illicit drugs.  Allergies: No Known Allergies  Medications Prior to Admission  Medication Sig Dispense Refill  . olmesartan (BENICAR) 20 MG tablet Take 10 mg by mouth daily.    . rosuvastatin (CRESTOR) 20 MG tablet Take 20 mg by mouth daily.      Results for orders placed or performed during the hospital encounter of 09/04/15 (from the past 48 hour(s))  Basic metabolic panel     Status: None   Collection Time: 09/04/15  1:07 PM  Result Value Ref Range   Sodium 142 135 - 145 mmol/L   Potassium 4.4 3.5 - 5.1 mmol/L   Chloride 105 101 - 111 mmol/L   CO2 27 22 - 32 mmol/L   Glucose, Bld 95 65 - 99 mg/dL   BUN 10 6 - 20 mg/dL   Creatinine, Ser 1.01 0.61 - 1.24 mg/dL   Calcium 9.5 8.9 - 10.3 mg/dL   GFR calc non  Af Amer >60 >60 mL/min   GFR calc Af Amer >60 >60 mL/min    Comment: (NOTE) The eGFR has been calculated using the CKD EPI equation. This calculation has not been validated in all clinical situations. eGFR's persistently <60 mL/min signify possible Chronic Kidney Disease.    Anion gap 10 5 - 15   No results found.  Review of Systems  Respiratory: Negative.   Gastrointestinal: Negative.   Genitourinary: Negative.   Neurological: Negative.   Endo/Heme/Allergies: Negative.     Blood pressure 147/69, pulse 78, temperature 98.8 F (37.1 C), temperature source Oral, resp. rate 14, height 6' (1.829 m), weight 83.915 kg (185 lb), SpO2 100 %. Physical Exam  Comminuted displaced right small finger proximal phalanx fracture.  Ecchymosis hematoma throughout the ulnar aspect of the hand.  He has intact compartments and is intact to neurovascular exam.   The patient is alert and oriented in no acute distress. The patient complains of pain in the affected upper extremity.  The patient is noted to have a normal HEENT exam. Lung fields show equal chest expansion and no shortness of breath. Abdomen exam is nontender without distention. Lower extremity examination does not show any fracture dislocation or blood clot symptoms. Pelvis is  stable and the neck and back are stable and nontender.Assessment/Plan Plan right small finger proximal phalanx reconstruction is necessary  Patient stands all risk and benefits and desires to proceed  We are planning surgery for your upper extremity. The risk and benefits of surgery to include risk of bleeding, infection, anesthesia,  damage to normal structures and failure of the surgery to accomplish its intended goals of relieving symptoms and restoring function have been discussed in detail. With this in mind we plan to proceed. I have specifically discussed with the patient the pre-and postoperative regime and the dos and don'ts and risk and benefits in  great detail. Risk and benefits of surgery also include risk of dystrophy(CRPS), chronic nerve pain, failure of the healing process to go onto completion and other inherent risks of surgery The relavent the pathophysiology of the disease/injury process, as well as the alternatives for treatment and postoperative course of action has been discussed in great detail with the patient who desires to proceed.  We will do everything in our power to help you (the patient) restore function to the upper extremity. It is a pleasure to see this patient today.  Paulene Floor, MD 09/04/2015, 3:26 PM

## 2015-09-04 NOTE — Anesthesia Preprocedure Evaluation (Addendum)
Anesthesia Evaluation  Patient identified by MRN, date of birth, ID band Patient awake    Reviewed: Allergy & Precautions, NPO status , Patient's Chart, lab work & pertinent test results  History of Anesthesia Complications Negative for: history of anesthetic complications  Airway Mallampati: II   Neck ROM: full    Dental  (+) Dental Advisory Given   Pulmonary neg pulmonary ROS,   Remote history of Spontaneous pneumothorax while in college    breath sounds clear to auscultation       Cardiovascular hypertension, + Valvular Problems/Murmurs  Rhythm:Regular Rate:Normal  MV prolapse    Neuro/Psych  Headaches, Headaches and occasional RLS  Neuromuscular disease negative psych ROS   GI/Hepatic negative GI ROS, Neg liver ROS,   Endo/Other    Renal/GU negative Renal ROS  negative genitourinary   Musculoskeletal negative musculoskeletal ROS (+)   Abdominal Normal abdominal exam  (+) - obese,  Abdomen: soft. Bowel sounds: normal.  Peds Pyloric Stenosis Celiac sprue - resolved ~12-13 y/o   Hematology   Anesthesia Other Findings   Reproductive/Obstetrics                          BP Readings from Last 3 Encounters:  09/04/15 137/88  06/30/15 122/76  04/13/15 110/64   CBC No results found for: WBC, RBC, HGB, HCT, PLT, MCV, MCH, MCHC, RDW, LYMPHSABS, MONOABS, EOSABS, BASOSABS    Chemistry   No results found for: NA, K, CL, CO2, BUN, CREATININE, GLU No results found for: CALCIUM, ALKPHOS, AST, ALT, BILITOT   EKG 09/04/15: Normal sinus rhythm with sinus arrhythmia Possible Left atrial enlargement Borderline ECG  Anesthesia Physical Anesthesia Plan  ASA: II  Anesthesia Plan: MAC and Regional   Post-op Pain Management:    Induction: Intravenous  Airway Management Planned: Simple Face Mask  Additional Equipment:   Intra-op Plan:   Post-operative Plan:   Informed Consent: I have  reviewed the patients History and Physical, chart, labs and discussed the procedure including the risks, benefits and alternatives for the proposed anesthesia with the patient or authorized representative who has indicated his/her understanding and acceptance.   Dental advisory given  Plan Discussed with: CRNA, Anesthesiologist and Surgeon  Anesthesia Plan Comments:       Anesthesia Quick Evaluation

## 2015-09-04 NOTE — Op Note (Signed)
NAMEMarland Kitchen  Harry Molina, Harry Molina NO.:  [id number]  MEDICAL RECORD NO.:  [id number]  LOCATION:  [facility]                         FACILITY:  [facility]  PHYSICIAN:  Dionne Ano. Catrinia Racicot, M.D.DATE OF BIRTH:  1971-04-24  DATE OF PROCEDURE: DATE OF DISCHARGE:  09/04/2015                              OPERATIVE REPORT   PREOPERATIVE DIAGNOSIS:  Comminuted complex intra-articular proximal phalanx fracture, right small finger.  POSTOPERATIVE DIAGNOSIS:  Comminuted complex intra-articular proximal phalanx fracture, right small finger.  PROCEDURE: 1. Open reduction and internal fixation, with ALPS 1.5 mm plate and     screw construct, right small finger proximal phalanx, secondary to     intra-articular fracture. 2. Extensive tenolysis, tenosynovectomy, extensive in nature, right     small finger. 3. AP, lateral, and oblique x-rays performed, examined, and     interpreted by myself, right small finger.  SURGEON:  Dionne Ano. Amanda Pea, M.D.  ASSISTANT:  None.  COMPLICATION:  None.  ANESTHESIA:  Block with IV sedation.  TOURNIQUET TIME:  Less than an hour.  INDICATIONS FOR PROCEDURE:  A pleasant male, who presents with the above- mentioned diagnosis.  I have discussed with him the risks and benefits of surgery.  OPERATIVE PROCEDURE:  The patient was seen by myself and Anesthesia, a block was placed in the preoperative holding area.  Ancef was given.  He was taken to the operative theater, prepped draped in usual sterile fashion with a pre-scrub with Hibiclens followed by Betadine surgical scrub 10 minutes in nature.  A time-out was observed.  Sterile field was secured.  Incision was made dorsoulnarly.  Dissection was carried down. Skin flap elevated.  The extensor tendon underwent tenolysis, tenosynovectomy extensive in nature, as there was bloody debris about it.  This was done and the tendon was mobilized nicely.  Following this, I placed a longitudinal split in the tendon and then  incised the periosteum away from longitudinal splint, so as to try to do good coverage in the future closure.  Dissection was carried down.  Far retractors placed.  Wound was evaluated.  Bony fragments curettaged. Irrigation applied and the bony fragments were then reassembled.  An ALPS splade plate was placed and an inner frag screw was first and foremost placed through the intra-articular portion.  Following a lone transverse screw for the intra-articular component, I then applied the splade plate without difficulty.  I pre-bent this and checked it under x- ray multiple times.  I was able to achieve 6 cortices proximally and 6 cortices distally.  There were no complicating features.  The plate was applied.  Excellent range of motion was noted.  AP, lateral, and oblique x-rays performed, examined, and interpreted by myself, looked excellent.  I looked at the finger splade closely.  I also looked at how stable he was with range of motion and he looked excellent.  Following this, I then closed the periosteum with a chromic suture and then closed the split and extensor apparatus with FiberWire of the 4-0 variety.  Once this done, skin was closed with Prolene with tourniquet down.  Hemostasis secured.  The patient tolerated this well. There were no complicating features.  All sponge, needle, and instrument counts were correct.  We will get him back in the office this Friday and we are going to begin early range of motion as he has a big setup for a lag about the extensor apparatus, if we do not move him quickly.  We worked really hard to make sure that we obtained excellent range of motion and excellent stability, so that we can move forward with aggressive therapeutic endeavors.  These notes have been discussed and all questions have been encouraged and answered.     Dionne Ano. Amanda Pea, M.D.     [facility]  D:  09/04/2015  T:  09/04/2015  Job:  [id number]

## 2015-09-04 NOTE — Progress Notes (Signed)
Orthopedic Tech Progress Note Patient Details:  Harry Molina 09-23-1970 [id number]  Ortho Devices Type of Ortho Device: Arm sling Ortho Device/Splint Location: RUE Ortho Device/Splint Interventions: Ordered, Application   Jennye Moccasin 09/04/2015, 6:04 PM

## 2015-09-04 NOTE — Anesthesia Procedure Notes (Signed)
Anesthesia Regional Block:  Supraclavicular block  Pre-Anesthetic Checklist: ,, timeout performed, Correct Patient, Correct Site, Correct Laterality, Correct Procedure, Correct Position, site marked, Risks and benefits discussed,  Surgical consent,  Pre-op evaluation,  At surgeon's request and post-op pain management  Laterality: Right  Prep: chloraprep       Needles:  Injection technique: Single-shot  Needle Type: Echogenic Stimulator Needle     Needle Length: 5cm 5 cm Needle Gauge: 22 and 22 G    Additional Needles:  Procedures: ultrasound guided (picture in chart) and nerve stimulator Supraclavicular block  Nerve Stimulator or Paresthesia:  Response: biceps flexion, 0.45 mA,   Additional Responses:   Narrative:  Start time: 09/04/2015 2:23 PM End time: 09/04/2015 2:32 PM Injection made incrementally with aspirations every 5 mL.  Performed by: Personally   Additional Notes: Functioning IV was confirmed and monitors were applied.  A 50mm 22ga Arrow echogenic stimulator needle was used. Sterile prep and drape,hand hygiene and sterile gloves were used.  Negative aspiration and negative test dose prior to incremental administration of local anesthetic. The patient tolerated the procedure well.  Ultrasound guidance: relevent anatomy identified, needle position confirmed, local anesthetic spread visualized around nerve(s), vascular puncture avoided.  Image printed for medical record.

## 2015-09-04 NOTE — Op Note (Signed)
See dictation#[id number] Sloan Galentine M.D.

## 2015-09-07 ENCOUNTER — Encounter (HOSPITAL_COMMUNITY): Payer: Self-pay | Admitting: Orthopedic Surgery

## 2015-12-07 ENCOUNTER — Emergency Department (HOSPITAL_COMMUNITY): Payer: 59

## 2015-12-07 ENCOUNTER — Emergency Department (HOSPITAL_COMMUNITY)
Admission: EM | Admit: 2015-12-07 | Discharge: 2015-12-07 | Disposition: A | Discharge status: Home / Self Care | Payer: 59 | Attending: Emergency Medicine | Admitting: Emergency Medicine

## 2015-12-07 ENCOUNTER — Encounter (HOSPITAL_COMMUNITY): Payer: Self-pay

## 2015-12-07 DIAGNOSIS — Z8709 Personal history of other diseases of the respiratory system: Secondary | ICD-10-CM | POA: Insufficient documentation

## 2015-12-07 DIAGNOSIS — Z79899 Other long term (current) drug therapy: Secondary | ICD-10-CM | POA: Insufficient documentation

## 2015-12-07 DIAGNOSIS — E785 Hyperlipidemia, unspecified: Secondary | ICD-10-CM | POA: Insufficient documentation

## 2015-12-07 DIAGNOSIS — R079 Chest pain, unspecified: Secondary | ICD-10-CM | POA: Diagnosis present

## 2015-12-07 DIAGNOSIS — F419 Anxiety disorder, unspecified: Secondary | ICD-10-CM | POA: Diagnosis not present

## 2015-12-07 DIAGNOSIS — Z8719 Personal history of other diseases of the digestive system: Secondary | ICD-10-CM | POA: Insufficient documentation

## 2015-12-07 DIAGNOSIS — I10 Essential (primary) hypertension: Secondary | ICD-10-CM | POA: Insufficient documentation

## 2015-12-07 LAB — CBC
HEMATOCRIT: 41.5 % (ref 39.0–52.0)
HEMOGLOBIN: 13.8 g/dL (ref 13.0–17.0)
MCH: 30.3 pg (ref 26.0–34.0)
MCHC: 33.3 g/dL (ref 30.0–36.0)
MCV: 91 fL (ref 78.0–100.0)
Platelets: 224 10*3/uL (ref 150–400)
RBC: 4.56 MIL/uL (ref 4.22–5.81)
RDW: 12.6 % (ref 11.5–15.5)
WBC: 5.8 10*3/uL (ref 4.0–10.5)

## 2015-12-07 LAB — COMPREHENSIVE METABOLIC PANEL
ALK PHOS: 46 U/L (ref 38–126)
ALT: 33 U/L (ref 17–63)
ANION GAP: 12 (ref 5–15)
AST: 29 U/L (ref 15–41)
Albumin: 4.5 g/dL (ref 3.5–5.0)
BILIRUBIN TOTAL: 1 mg/dL (ref 0.3–1.2)
BUN: 13 mg/dL (ref 6–20)
CALCIUM: 9.4 mg/dL (ref 8.9–10.3)
CO2: 25 mmol/L (ref 22–32)
Chloride: 104 mmol/L (ref 101–111)
Creatinine, Ser: 0.94 mg/dL (ref 0.61–1.24)
GFR calc Af Amer: >60 mL/min (ref >60)
GLUCOSE: 105 mg/dL — AB (ref 65–99)
POTASSIUM: 4 mmol/L (ref 3.5–5.1)
Sodium: 141 mmol/L (ref 135–145)
TOTAL PROTEIN: 6.9 g/dL (ref 6.5–8.1)

## 2015-12-07 LAB — I-STAT TROPONIN, ED: TROPONIN I, POC: 0 ng/mL (ref 0.00–0.08)

## 2015-12-07 LAB — TROPONIN I

## 2015-12-07 MED ORDER — ASPIRIN 325 MG PO TABS
325.0000 mg | ORAL_TABLET | Freq: Every day | ORAL | Status: DC
  Administered 2015-12-07: 325 mg via ORAL
  Filled 2015-12-07: qty 1

## 2015-12-07 NOTE — ED Provider Notes (Signed)
CSN: [id number]     Arrival date & time 12/07/15  1444 History   First MD Initiated Contact with Patient 12/07/15 1534     Chief Complaint  Patient presents with  . Chest Pain     (Consider location/radiation/quality/duration/timing/severity/associated sxs/prior Treatment) Patient is a 45 y.o. male presenting with chest pain. The history is provided by the patient.  Chest Pain Pain location:  L chest Pain quality: aching and pressure   Pain radiates to:  Does not radiate Pain radiates to the back: no   Pain severity:  Mild Onset quality:  Gradual Duration:  2 weeks Timing:  Intermittent Progression:  Waxing and waning Chronicity:  New Context: at rest (mostly at night when he is still.  No exertional. Usually doesnt feel anything when active)   Context: not breathing, not eating, not lifting, no movement and not raising an arm   Relieved by:  Nothing Worsened by:  Nothing tried Ineffective treatments:  Rest Associated symptoms: anxiety   Associated symptoms: no abdominal pain, no altered mental status, no anorexia, no back pain, no claudication, no cough, no fatigue, no fever, no headache, no heartburn, no lower extremity edema, no nausea, no orthopnea, no palpitations, no shortness of breath, no syncope, not vomiting and no weakness   Risk factors: high cholesterol, hypertension and Marfan's syndrome   Risk factors: no aortic disease, no coronary artery disease, no diabetes mellitus, no immobilization, not obese, no prior DVT/PE and no smoking   Risk factors comment:  FHx of CAD < 55   Past Medical History  Diagnosis Date  . Hyperlipidemia   . Hypertension   . Lactose intolerance   . Celiac sprue     Resolved at age 45-13  . Spontaneous pneumothorax     unsure side  . Pyloric stenosis   . Mitral valve prolapse    Past Surgical History  Procedure Laterality Date  . Pyloric stenosis    . Biopsy stomach    . Open reduction internal fixation (orif) proximal phalanx Right  09/04/2015    Procedure: OPEN REDUCTION INTERNAL FIXATION (ORIF) RIGHT SMALL FINGER PROXIMAL PHALANX;  Surgeon: Dominica SeverinWilliam Gramig, MD;  Location: [facility] OR;  Service: Orthopedics;  Laterality: Right;   Family History  Problem Relation Age of Onset  . Heart disease Father   . Hyperlipidemia Father   . Heart attack Father    Social History  Substance Use Topics  . Smoking status: Never Smoker   . Smokeless tobacco: Current User    Types: Snuff  . Alcohol Use: 0.0 oz/week    0 Standard drinks or equivalent per week     Comment: social    Review of Systems  Constitutional: Negative for fever, chills, activity change, appetite change and fatigue.  HENT: Negative for congestion.   Respiratory: Negative for cough, shortness of breath and wheezing.   Cardiovascular: Positive for chest pain. Negative for palpitations, orthopnea, claudication, leg swelling and syncope.  Gastrointestinal: Negative for heartburn, nausea, vomiting, abdominal pain and anorexia.  Genitourinary: Negative for flank pain.  Musculoskeletal: Negative for back pain.  Skin: Negative for rash.  Neurological: Negative for syncope, weakness and headaches.  All other systems reviewed and are negative.   Allergies  Review of patient's allergies indicates no known allergies.  Home Medications   Prior to Admission medications   Medication Sig Start Date End Date Taking? Authorizing Provider  loratadine (CLARITIN) 10 MG tablet Take 10 mg by mouth daily.   Yes Historical Provider, MD  olmesartan (  BENICAR) 20 MG tablet Take 10 mg by mouth daily.   Yes Historical Provider, MD  rosuvastatin (CRESTOR) 20 MG tablet Take 20 mg by mouth daily.   Yes Historical Provider, MD  cephALEXin (KEFLEX) 500 MG capsule Take 1 capsule (500 mg total) by mouth 4 (four) times daily. Patient not taking: Reported on 12/07/2015 09/04/15   Dominica Severin, MD  HYDROcodone-acetaminophen [facility]) 10-325 MG tablet Take 1 tablet by mouth every 4 (four) hours as  needed for moderate pain. Patient not taking: Reported on 12/07/2015 09/04/15   Dominica Severin, MD   BP 123/77 mmHg  Pulse 70  Temp(Src) 98.7 F (37.1 C) (Oral)  Resp 12  Ht 6\' 1"  (1.854 m)  Wt 84.369 kg  BMI 24.55 kg/m2  SpO2 100% Physical Exam  Constitutional: He is oriented to person, place, and time. He appears well-developed and well-nourished. No distress.  Very comfortable smiling  HENT:  Head: Normocephalic and atraumatic.  Nose: Nose normal.  Mouth/Throat: No oropharyngeal exudate.  Eyes: Conjunctivae are normal. Pupils are equal, round, and reactive to light.  Neck: Normal range of motion. Neck supple. No JVD present. No tracheal deviation present.  Cardiovascular: Normal rate, regular rhythm and normal heart sounds.   No murmur heard. Pulmonary/Chest: Effort normal and breath sounds normal. No respiratory distress. He has no wheezes. He has no rales. He exhibits no tenderness.  Abdominal: Soft. Bowel sounds are normal. He exhibits no distension and no mass. There is no tenderness. There is no rebound.  Musculoskeletal: Normal range of motion. He exhibits no edema.  No lower extremity edema, calf tenderness, warmth, erythema or palpable cords   Neurological: He is alert and oriented to person, place, and time.  Skin: Skin is warm and dry. No rash noted.  Psychiatric: He has a normal mood and affect.  Nursing note and vitals reviewed.   ED Course  Procedures (including critical care time) Labs Review Labs Reviewed  COMPREHENSIVE METABOLIC PANEL - Abnormal; Notable for the following:    Glucose, Bld 105 (*)    All other components within normal limits  CBC  TROPONIN I  I-STAT TROPOININ, ED    Imaging Review Dg Chest 2 View  12/07/2015  CLINICAL DATA:  Chest pain and short of breath for few weeks EXAM: CHEST  2 VIEW COMPARISON:  10/16/2004 FINDINGS: Normal heart size. Lungs clear. No pneumothorax. No pleural effusion. IMPRESSION: No active cardiopulmonary disease.  Electronically Signed   By: Jolaine Click M.D.   On: 12/07/2015 16:16   I have personally reviewed and evaluated these images and lab results as part of my medical decision-making.   EKG Interpretation   Date/Time:  Tuesday Dec 07 2015 14:52:57 EDT Ventricular Rate:  87 PR Interval:  146 QRS Duration: 92 QT Interval:  364 QTC Calculation: 438 R Axis:   85 Text Interpretation:  Normal sinus rhythm with sinus arrhythmia Normal ECG  No significant change since last tracing No acute changes Confirmed by  Rhunette Croft, MD, Janey Genta [phone]) on 12/07/2015 6:13:43 PM      MDM   Final diagnoses:  Left sided chest pain    VSS. Wells 0, PERC neg. Doubt PE.  No s.s DVT. CXR wo PTX or PNA.  Nml mediastinum, pain is mild, equal distal radial pusles, doubt dissection.  Does have hx of reflux, consider esophageal spasm as dx of exclusion.  Pain is located around costochondral joins, could have MSK source, again dx of exclusion.  Pt is mostly concerned about ACS given  fam hx. HEAR 3.  EKG wo ischemic pattern, no STE by criteria.  Trop x2 negative, doubt active ischemic event.    EKG: normal EKG, normal sinus rhythm, normal sinus rhythm, no q waves, no right heart strain pattern, no ST/TW abnormality.    Spoke with outpt cardiology who will schedule outpt stress testing.    Counseled on s/s warranting return to ED.        Sofie Rower, MD 12/07/15 6045  Derwood Kaplan, MD 12/08/15 [id number]

## 2015-12-07 NOTE — ED Notes (Signed)
Patient transported to X-ray 

## 2015-12-07 NOTE — ED Notes (Signed)
Pt verbalized understanding of d/c instructions and follow-up care. No further questions/concerns, VSS, ambulatory w/ steady gait (refused wheelchair) 

## 2015-12-07 NOTE — ED Notes (Signed)
Pt presents with 2 week h/o L sided chest pain that has been constant x 2-3 days.  -shortness of breath; reports indigestion

## 2015-12-07 NOTE — Discharge Instructions (Signed)
[facility] should call you to schedule stress testing.

## 2015-12-23 ENCOUNTER — Telehealth: Payer: Self-pay | Admitting: Cardiovascular Disease

## 2015-12-23 NOTE — Telephone Encounter (Signed)
[facility] appointment on 12/31/15 with Dr Duke Salviaandolph.  [facility] Dr Leonides Sakeandolph's schedule on 12/31/15. lp

## 2015-12-31 ENCOUNTER — Ambulatory Visit: Payer: 59 | Admitting: Cardiovascular Disease

## 2016-01-07 ENCOUNTER — Encounter: Payer: Self-pay | Admitting: Cardiovascular Disease

## 2016-01-07 ENCOUNTER — Ambulatory Visit (INDEPENDENT_AMBULATORY_CARE_PROVIDER_SITE_OTHER): Payer: 59 | Admitting: Cardiovascular Disease

## 2016-01-07 VITALS — BP 118/72 | HR 76 | Ht 73.0 in | Wt 187.8 lb

## 2016-01-07 DIAGNOSIS — R079 Chest pain, unspecified: Secondary | ICD-10-CM

## 2016-01-07 NOTE — Progress Notes (Signed)
Cardiology Office Note Date:  01/07/2016   ID:  Harry Molina, DOB 11/14/1970, MRN [id number]  PCP:  Hoyle SauerAVVA,RAVISANKAR R, MD  Cardiologist:  Tonny Bollmanooper, Zeshan Sena, MD    Chief Complaint  Patient presents with  . Chest Pain    History of Present Illness: Harry Molina is a 45 y.o. male who presents for initial evaluation of chest pain. He experienced chest pain and pressure in the left chest over a period a month on an intermittent basis. This was unrelated to physical exertion. He was evaluated in the Emergency Room 12/07/2015 and had normal serial troponins and a normal EKG. He was discharged home and advised to undergo cardiac evaluation. The patient is known to me as I care for his father who has CAD and ischemic cardiomyopathy, first MI in his 9540's.   He's physically active and has had no exertional chest pain or pressure. He does have mild shortness of breath at times both at rest and with activity. He denies edema, orthopnea, PND, cough, fever, or chills. He has no personal history of cardiac disease. However, he has been treated for hypertension and hyperlipidemia.  He's here alone today. His kids are 7, 10, and 14. 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 Overall he feels well.  Past Medical History  Diagnosis Date  . Hyperlipidemia   . Hypertension   . Lactose intolerance   . Celiac sprue     Resolved at age 45-13  . Spontaneous pneumothorax     unsure side  . Pyloric stenosis   . Mitral valve prolapse     Past Surgical History  Procedure Laterality Date  . Pyloric stenosis    . Biopsy stomach    . Open reduction internal fixation (orif) proximal phalanx Right 09/04/2015    Procedure: OPEN REDUCTION INTERNAL FIXATION (ORIF) RIGHT SMALL FINGER PROXIMAL PHALANX;  Surgeon: Dominica SeverinWilliam Gramig, MD;  Location: [facility] OR;  Service: Orthopedics;  Laterality: Right;    Current Outpatient Prescriptions  Medication Sig Dispense Refill  . olmesartan (BENICAR) 20 MG tablet Take 10 mg by  mouth daily.    . rosuvastatin (CRESTOR) 20 MG tablet Take 20 mg by mouth daily.     No current facility-administered medications for this visit.    Allergies:   Review of patient's allergies indicates no known allergies.   Social History:  The patient  reports that he has never smoked. His smokeless tobacco use includes Snuff. He reports that he drinks alcohol. He reports that he does not use illicit drugs.   Family History:  The patient's family history includes Heart attack in his father; Heart disease in his father; Hyperlipidemia in his father.   ROS:  Please see the history of present illness.  Otherwise, review of systems is positive for dizziness, fatigue, snoring.  All other systems are reviewed and negative.   PHYSICAL EXAM: VS:  BP 118/72 mmHg  Pulse 76  Ht 6\' 1"  (1.854 m)  Wt 187 lb 12.8 oz (85.186 kg)  BMI 24.78 kg/m2 , BMI Body mass index is 24.78 kg/(m^2). GEN: Well nourished, well developed, in no acute distress HEENT: normal Neck: no JVD, no masses. No carotid bruits Cardiac: RRR without murmur or gallop                Respiratory:  clear to auscultation bilaterally, normal work of breathing GI: soft, nontender, nondistended, + BS MS: no deformity or atrophy Ext: no pretibial edema, pedal pulses 2+= bilaterally Skin: warm and dry, no  rash Neuro:  Strength and sensation are intact Psych: euthymic mood, full affect  EKG:  EKG is not ordered today. The ekg from 12/07/2015 shows NSR, within normal limits  Recent Labs: 12/07/2015: ALT 33; BUN 13; Creatinine, Ser 0.94; Hemoglobin 13.8; Platelets 224; Potassium 4.0; Sodium 141   Lipid Panel  No results found for: CHOL, TRIG, HDL, CHOLHDL, VLDL, LDLCALC, LDLDIRECT    Wt Readings from Last 3 Encounters:  01/07/16 187 lb 12.8 oz (85.186 kg)  12/07/15 186 lb (84.369 kg)  09/04/15 185 lb (83.915 kg)    Cardiac Studies Reviewed: EKG, troponin and other lab work, chest x-ray reviewed from the emergency  department  ASSESSMENT AND PLAN: 1.  Chest pain, primarily atypical features. Considering his family history of premature CAD, personal history of hypertension and hyperlipidemia, and associated shortness of breath, I have recommended an exercise stress echocardiogram to evaluate for ischemic heart disease. We discussed screening for preclinical atherosclerosis as well. Will do a coronary calcium CT score as well.  2. Essential hypertension: Blood pressure is well controlled on Benicar. Followed by Dr. Felipa Eth  3. Hyperlipidemia: treated with a statin drug. Lipids reviewed from Dr Vicente Males office: Cholesterol 124, triglycerides 118, HDL 35, LDL 65.  Current medicines are reviewed with the patient today.  The patient does not have concerns regarding medicines.  Labs/ tests ordered today include:   Orders Placed This Encounter  Procedures  . CT Cardiac Scoring  . ECHO STRESS TEST   Disposition:   FU 2 years as long as stress test is normal.  Signed, Tonny Bollman, MD  01/07/2016 5:30 PM    [facility] [address] [place], [place], [place]  [address] Phone: [phone]; Fax: [phone]

## 2016-01-07 NOTE — Patient Instructions (Signed)
Medication Instructions:  Your physician recommends that you continue on your current medications as directed. Please refer to the Current Medication list given to you today.  Labwork: No new orders.   Testing/Procedures: Your physician has requested that you have a stress echocardiogram. For further information please visit [email or web address]. Please follow instruction sheet as given.  Your physician has recommended a Coronary Calcium Score ($150 out of pocket)  Follow-Up: Your physician wants you to follow-up in: 2 YEARS with Dr Excell Seltzerooper.  You will receive a reminder letter in the mail two months in advance. If you don't receive a letter, please call our office to schedule the follow-up appointment.   Any Other Special Instructions Will Be Listed Below (If Applicable).     If you need a refill on your cardiac medications before your next appointment, please call your pharmacy.

## 2016-02-08 ENCOUNTER — Telehealth (HOSPITAL_COMMUNITY): Payer: Self-pay | Admitting: *Deleted

## 2016-02-08 NOTE — Telephone Encounter (Signed)
Patient given detailed instructions per Stress Test Requisition Sheet for test on 02/10/16 at 2:30.Patient Notified to arrive 30 minutes early, and that it is imperative to arrive on time for appointment to keep from having the test rescheduled.  Patient verbalized understanding. Daneil DolinSharon S Brooks

## 2016-02-09 ENCOUNTER — Other Ambulatory Visit: Payer: Self-pay

## 2016-02-09 ENCOUNTER — Telehealth: Payer: Self-pay | Admitting: Cardiovascular Disease

## 2016-02-09 DIAGNOSIS — R079 Chest pain, unspecified: Secondary | ICD-10-CM

## 2016-02-09 NOTE — Telephone Encounter (Signed)
I called the patient and left a voicemail with my phone number on both his cell phone and home phone to call me back to schedule the GXT as the stress echo was not covered under his insurance plan.

## 2016-02-10 ENCOUNTER — Telehealth: Payer: Self-pay | Admitting: Cardiovascular Disease

## 2016-02-10 ENCOUNTER — Ambulatory Visit (HOSPITAL_COMMUNITY): Payer: 59 | Attending: Internal Medicine

## 2016-02-10 ENCOUNTER — Other Ambulatory Visit: Payer: Self-pay

## 2016-02-10 ENCOUNTER — Other Ambulatory Visit (HOSPITAL_COMMUNITY): Payer: 59

## 2016-02-10 ENCOUNTER — Other Ambulatory Visit: Payer: Self-pay | Admitting: Cardiovascular Disease

## 2016-02-10 ENCOUNTER — Ambulatory Visit (INDEPENDENT_AMBULATORY_CARE_PROVIDER_SITE_OTHER)
Admission: RE | Admit: 2016-02-10 | Discharge: 2016-02-10 | Disposition: A | Discharge status: Home / Self Care | Payer: Self-pay | Source: Ambulatory Visit | Attending: Cardiovascular Disease | Admitting: Cardiovascular Disease

## 2016-02-10 DIAGNOSIS — R079 Chest pain, unspecified: Secondary | ICD-10-CM

## 2016-02-10 DIAGNOSIS — I10 Essential (primary) hypertension: Secondary | ICD-10-CM | POA: Insufficient documentation

## 2016-02-10 DIAGNOSIS — I351 Nonrheumatic aortic (valve) insufficiency: Secondary | ICD-10-CM | POA: Insufficient documentation

## 2016-02-10 DIAGNOSIS — E785 Hyperlipidemia, unspecified: Secondary | ICD-10-CM | POA: Diagnosis not present

## 2016-02-10 NOTE — Telephone Encounter (Signed)
I would defer this to Dr. Earmon Phoenixooper's opinion. I did not formally see this patient.

## 2016-02-10 NOTE — Telephone Encounter (Signed)
Spoke w/pt.  Pt was to have Cardiac CA Score CT today and stress echo.  Stress echo is in medical review w/[employer] - contracted auth company for [employer].  Per Norma FredricksonLori Gerhardt, NP ok for pt to have GXT and echo.  Pt was r/s'd for 2D echo today.  [place], [place] scheduler left message for pt about echo and GXT.  She is still arranging when pt can have GXT.  Pt wanted phone# and case# to call and discuss his pending case#.  He will decide if he wants to do 2D echo still today or wait for CT  calcium score results to see if they may necessitate approval for stress echo.

## 2016-02-10 NOTE — Telephone Encounter (Signed)
Per [name]. Pt is  coming for the Calcium score and stress echo today. The stress echo was denied per insurance. Would it be best to cancelled the echo and wait until the results from the calcium score results, to see if they may need to approve the stress echo. Harry Molina has tried to call  And she has been  unable to get anyone at echo.

## 2016-02-15 NOTE — Telephone Encounter (Signed)
Reviewed findings with patient. Mild AI noted - images reviewed. Otherwise echo is normal. Recommend FU echo in one year with appt that same day. Also recommend POET for assessment of chest pain. Advised we will call him to arrange treadmill study.   Harry BollmanCooper, Jonathan Corpus  02/15/2016 2:17 PM

## 2016-02-16 NOTE — Telephone Encounter (Signed)
Pt scheduled for POET 03/15/16.  Recalls placed in the system for repeat OV and echo in 1 year.

## 2016-03-15 ENCOUNTER — Ambulatory Visit (INDEPENDENT_AMBULATORY_CARE_PROVIDER_SITE_OTHER): Payer: 59

## 2016-03-15 ENCOUNTER — Encounter (INDEPENDENT_AMBULATORY_CARE_PROVIDER_SITE_OTHER): Payer: Self-pay

## 2016-03-15 DIAGNOSIS — R079 Chest pain, unspecified: Secondary | ICD-10-CM

## 2016-03-15 LAB — EXERCISE TOLERANCE TEST
CHL RATE OF PERCEIVED EXERTION: 16
CSEPED: 12 min
CSEPEDS: 0 s
CSEPHR: 106 %
Estimated workload: 13.4 METS
MPHR: 176 {beats}/min
Peak HR: 187 {beats}/min
Rest HR: 77 {beats}/min

## 2016-03-22 ENCOUNTER — Telehealth: Payer: Self-pay | Admitting: *Deleted

## 2016-03-22 DIAGNOSIS — R079 Chest pain, unspecified: Secondary | ICD-10-CM

## 2016-03-22 NOTE — Telephone Encounter (Signed)
Dr. Excell Seltzerooper reviewed GXT results w/pt and recommendation for stress echo to be done. Pt agreeable to plan of care. Order placed today, [facility] . Pt agreeble.

## 2016-03-30 ENCOUNTER — Telehealth (HOSPITAL_COMMUNITY): Payer: Self-pay

## 2016-04-05 ENCOUNTER — Other Ambulatory Visit (HOSPITAL_COMMUNITY): Payer: 59

## 2016-04-17 ENCOUNTER — Telehealth (HOSPITAL_COMMUNITY): Payer: Self-pay | Admitting: *Deleted

## 2016-04-17 NOTE — Telephone Encounter (Signed)
Patient given detailed instructions per Stress Test Requisition Sheet for test on 04/20/16 at 2:30.Patient Notified to arrive 30 minutes early, and that it is imperative to arrive on time for appointment to keep from having the test rescheduled.  Patient verbalized understanding. Daneil DolinSharon S Brooks

## 2016-04-20 ENCOUNTER — Ambulatory Visit (HOSPITAL_BASED_OUTPATIENT_CLINIC_OR_DEPARTMENT_OTHER): Payer: 59

## 2016-04-20 ENCOUNTER — Ambulatory Visit (HOSPITAL_COMMUNITY): Payer: 59 | Attending: Internal Medicine

## 2016-04-20 DIAGNOSIS — R079 Chest pain, unspecified: Secondary | ICD-10-CM

## 2016-04-20 MED ORDER — PERFLUTREN LIPID MICROSPHERE
1.0000 mL | INTRAVENOUS | Status: AC | PRN
  Administered 2016-04-20: 2 mL via INTRAVENOUS
  Administered 2016-04-20 (×2): 1 mL via INTRAVENOUS

## 2016-08-27 DIAGNOSIS — Z23 Encounter for immunization: Secondary | ICD-10-CM | POA: Diagnosis not present

## 2016-12-29 DIAGNOSIS — I10 Essential (primary) hypertension: Secondary | ICD-10-CM | POA: Diagnosis not present

## 2016-12-29 DIAGNOSIS — Z125 Encounter for screening for malignant neoplasm of prostate: Secondary | ICD-10-CM | POA: Diagnosis not present

## 2016-12-29 DIAGNOSIS — E784 Other hyperlipidemia: Secondary | ICD-10-CM | POA: Diagnosis not present

## 2017-01-03 DIAGNOSIS — R079 Chest pain, unspecified: Secondary | ICD-10-CM | POA: Diagnosis not present

## 2017-01-03 DIAGNOSIS — Z0000 Encounter for general adult medical examination without abnormal findings: Secondary | ICD-10-CM | POA: Diagnosis not present

## 2017-01-03 DIAGNOSIS — I10 Essential (primary) hypertension: Secondary | ICD-10-CM | POA: Diagnosis not present

## 2017-01-03 DIAGNOSIS — E784 Other hyperlipidemia: Secondary | ICD-10-CM | POA: Diagnosis not present

## 2017-01-03 DIAGNOSIS — R3121 Asymptomatic microscopic hematuria: Secondary | ICD-10-CM | POA: Diagnosis not present

## 2017-01-03 DIAGNOSIS — Z8249 Family history of ischemic heart disease and other diseases of the circulatory system: Secondary | ICD-10-CM | POA: Diagnosis not present

## 2017-02-26 DIAGNOSIS — R5383 Other fatigue: Secondary | ICD-10-CM | POA: Diagnosis not present

## 2017-02-26 DIAGNOSIS — E291 Testicular hypofunction: Secondary | ICD-10-CM | POA: Diagnosis not present

## 2017-02-26 DIAGNOSIS — R195 Other fecal abnormalities: Secondary | ICD-10-CM | POA: Diagnosis not present

## 2017-06-27 ENCOUNTER — Ambulatory Visit (HOSPITAL_COMMUNITY): Payer: 59 | Attending: Cardiology

## 2017-06-27 ENCOUNTER — Other Ambulatory Visit: Payer: Self-pay

## 2017-06-27 DIAGNOSIS — I061 Rheumatic aortic insufficiency: Secondary | ICD-10-CM | POA: Insufficient documentation

## 2017-06-27 DIAGNOSIS — I351 Nonrheumatic aortic (valve) insufficiency: Secondary | ICD-10-CM | POA: Diagnosis not present

## 2017-06-27 LAB — ECHOCARDIOGRAM COMPLETE
AOASC: 26 cm
AVPHT: 477 ms
CHL CUP DOP CALC LVOT VTI: 17.9 cm
CHL CUP MV DEC (S): 232
E decel time: 232 msec
EERAT: 6.16
FS: 30 % (ref 28–44)
IV/PV OW: 0.91
LA ID, A-P, ES: 37 mm
LA diam end sys: 37 mm
LA diam index: 1.77 cm/m2
LA vol: 32.6 mL
LAVOLA4C: 28.4 mL
LAVOLIN: 15.6 mL/m2
LDCA: 3.46 cm2
LV PW d: 9.4 mm — AB (ref 0.6–1.1)
LV e' LATERAL: 14.8 cm/s
LVEEAVG: 6.16
LVEEMED: 6.16
LVOT SV: 62 mL
LVOT peak vel: 90.9 cm/s
LVOTD: 21 mm
Lateral S' vel: 12.1 cm/s
MV Peak grad: 3 mmHg
MVPKAVEL: 71.3 m/s
MVPKEVEL: 91.2 m/s
RV sys press: 24 mmHg
Reg peak vel: 229 cm/s
TAPSE: 19.5 mm
TDI e' lateral: 14.8
TDI e' medial: 10.2
TRMAXVEL: 229 cm/s

## 2017-07-05 ENCOUNTER — Ambulatory Visit: Payer: 59 | Admitting: Cardiovascular Disease

## 2017-07-05 ENCOUNTER — Encounter (INDEPENDENT_AMBULATORY_CARE_PROVIDER_SITE_OTHER): Payer: Self-pay

## 2017-07-05 ENCOUNTER — Encounter: Payer: Self-pay | Admitting: Cardiovascular Disease

## 2017-07-05 VITALS — BP 140/80 | HR 77 | Ht 73.0 in | Wt 192.8 lb

## 2017-07-05 DIAGNOSIS — I10 Essential (primary) hypertension: Secondary | ICD-10-CM | POA: Diagnosis not present

## 2017-07-05 DIAGNOSIS — E785 Hyperlipidemia, unspecified: Secondary | ICD-10-CM | POA: Diagnosis not present

## 2017-07-05 DIAGNOSIS — I351 Nonrheumatic aortic (valve) insufficiency: Secondary | ICD-10-CM

## 2017-07-05 NOTE — Patient Instructions (Signed)

## 2017-07-05 NOTE — Progress Notes (Signed)
Cardiology Office Note Date:  07/05/2017   ID:  Harry Molina, DOB 10/06/1970, MRN [id number]  PCP:  Chilton GreathouseAvva, Ravisankar, MD  Cardiologist:  Tonny BollmanMichael Tandy Lewin, MD    Chief Complaint  Patient presents with  . Follow-up    Chest pain   History of Present Illness: Harry Molina is a 46 y.o. male who presents for follow-up evaluation.  The patient was seen in 2017 chest pain.  He had a exercise ECG with ST depression but no symptoms and good exercise tolerance.  Stress echocardiogram was performed and demonstrated no abnormalities at rest or with exertion.  A coronary calcium CT demonstrated 0 coronary calcification.  He has been followed for hypertension.  He quit taking his medicine about 6 months ago and took his blood pressure at home, noting that it was markedly elevated with a reading around 180/90.  He started back on his Benicar at the previous dose and blood pressure is subsequently improved into the range of 135-145 over 80s-90s.  He is only been back on medicine for a few days.  He denies chest pain, chest pressure, or exercise intolerance.  He denies edema, orthopnea, or PND. Does have mild exertional dyspnea.   Past Medical History:  Diagnosis Date  . Celiac sprue    Resolved at age 46-13  . Hyperlipidemia   . Hypertension   . Lactose intolerance   . Mitral valve prolapse   . Pyloric stenosis   . Spontaneous pneumothorax    unsure side    Past Surgical History:  Procedure Laterality Date  . BIOPSY STOMACH    . OPEN REDUCTION INTERNAL FIXATION (ORIF) PROXIMAL PHALANX Right 09/04/2015   Procedure: OPEN REDUCTION INTERNAL FIXATION (ORIF) RIGHT SMALL FINGER PROXIMAL PHALANX;  Surgeon: Dominica SeverinWilliam Gramig, MD;  Location: [facility] OR;  Service: Orthopedics;  Laterality: Right;  . pyloric stenosis      Current Outpatient Medications  Medication Sig Dispense Refill  . aspirin EC 81 MG tablet Take 81 mg by mouth daily.    Marland Kitchen. olmesartan (BENICAR) 40 MG tablet Take 20 mg by mouth  daily.     . rosuvastatin (CRESTOR) 20 MG tablet Take 20 mg by mouth daily.     No current facility-administered medications for this visit.     Allergies:   Patient has no known allergies.   Social History:  The patient  reports that  has never smoked. His smokeless tobacco use includes snuff. He reports that he drinks alcohol. He reports that he does not use drugs.   Family History:  The patient's family history includes Heart attack in his father; Heart disease in his father; Hyperlipidemia in his father.    ROS:  Please see the history of present illness.  Otherwise, review of systems is positive for dizziness, snoring, palpitations, fatigue, dizziness.  All other systems are reviewed and negative.    PHYSICAL EXAM: VS:  BP 140/80   Pulse 77   Ht 6\' 1"  (1.854 m)   Wt 192 lb 12.8 oz (87.5 kg)   SpO2 97%   BMI 25.44 kg/m  , BMI Body mass index is 25.44 kg/m. GEN: Well nourished, well developed, in no acute distress  HEENT: normal  Neck: no JVD, no masses. No carotid bruits Cardiac: RRR without murmur or gallop                Respiratory:  clear to auscultation bilaterally, normal work of breathing GI: soft, nontender, nondistended, + BS MS: no deformity or atrophy  Ext: no pretibial edema, pedal pulses 2+= bilaterally Skin: warm and dry, no rash Neuro:  Strength and sensation are intact Psych: euthymic mood, full affect  EKG:  EKG is ordered today. The ekg ordered today shows normal sinus rhythm 77 bpm within normal limits.  Recent Labs: No results found for requested labs within last 8760 hours.   Lipid Panel  No results found for: CHOL, TRIG, HDL, CHOLHDL, VLDL, LDLCALC, LDLDIRECT    Wt Readings from Last 3 Encounters:  07/05/17 192 lb 12.8 oz (87.5 kg)  01/07/16 187 lb 12.8 oz (85.2 kg)  12/07/15 186 lb (84.4 kg)     Cardiac Studies Reviewed: Coronary CT calcium score 02/10/2016: TECHNIQUE: The patient was scanned on a Siemens Somatom 64 slice scanner.  Axial non-contrast 3 mm slices were carried out through the heart. The data set was analyzed on a dedicated work station and scored using the Agatson method.  FINDINGS: Non-cardiac: [facility].  Ascending Aorta:  Normal size, no calcifications.  Pericardium: Normal.  Coronary arteries:  Normal origin.  IMPRESSION: Coronary calcium score of 0. This was 0 percentile for age and sex matched control.  Harry Molina  Exercise tolerance test 03/15/2016: Study Highlights     Blood pressure demonstrated a normal response to exercise.  Horizontal ST segment depression ST segment depression of 1 mm was noted during stress in the II and III leads.   Positive ETT with a mm of horizonatal ST depression In leads 2,3 peak stress   Stress echocardiogram 04/20/2016: Baseline:  - LV global systolic function was normal. - Normal wall motion; no LV regional wall motion abnormalities.  Immediate post stress:  - LV global systolic function was vigorous. - No evidence for new LV regional wall motion abnormalities.  ------------------------------------------------------------------- Prepared and Electronically Authenticated by  Dietrich PatesPaula Ross, M.D. 2017-09-21T16:45:13  Echo: Left ventricle: The cavity size was normal. Wall thickness was normal. Systolic function was normal. The estimated ejection fraction was in the range of 55% to 60%. Wall motion was normal; there were no regional wall motion abnormalities. The transmitral flow pattern was normal. The deceleration time of the early transmitral flow velocity was normal. The pulmonary vein flow pattern was normal. The tissue Doppler parameters were normal. Left ventricular diastolic function parameters were normal.  ------------------------------------------------------------------- Aortic valve:  Structurally normal valve.  Cusp separation was normal. Doppler: Transvalvular  velocity was within the normal range. There was no stenosis. There was mild regurgitation.  ------------------------------------------------------------------- Mitral valve:  Structurally normal valve.  Leaflet separation was normal. On the 2-chamber view there is flat closure of the mitral valve leaflets. Otherwise no evidence of MV prolapse. Doppler: Transvalvular velocity was within the normal range. There was no evidence for stenosis. There was no regurgitation.  Peak gradient (D): 3 mm Hg.  ------------------------------------------------------------------- Left atrium: The atrium was normal in size.  ------------------------------------------------------------------- Right ventricle: The cavity size was normal. Wall thickness was normal. Systolic function was normal.  ------------------------------------------------------------------- Pulmonic valve:  The valve appears to be grossly normal. Doppler: There was no significant regurgitation.  ------------------------------------------------------------------- Tricuspid valve:  Structurally normal valve.  Leaflet separation was normal. Doppler: Transvalvular velocity was within the normal range. There was no regurgitation.  ------------------------------------------------------------------- Right atrium: The atrium was normal in size.  ------------------------------------------------------------------- Pericardium: There was no pericardial effusion.  ------------------------------------------------------------------- Systemic veins: Inferior vena cava: The vessel was normal in size. The respirophasic diameter changes were in the normal range (>= 50%), consistent with normal central venous pressure.  ASSESSMENT AND PLAN: 1.  Hypertension: Blood pressure better controlled now that he is back on olmesartan.  He will continue to monitor this and if it remains mildly elevated he will increase the dose to 40 mg  daily.  He will follow-up with Dr. Felipa Eth for his annual physical next year.  2.  Hyperlipidemia: Treated with Crestor.  The importance of medication compliance is reviewed with the patient.  Followed by his primary physician.  3.  Mild aortic valve insufficiency: We reviewed his echo images today.  He appears to have a normal trileaflet aortic valve with mild central aortic insufficiency.  Plan to follow-up with a repeat echo study in 2 years.  I would like to see him back next year for clinical follow-up.  4.  Cardiovascular risk reduction: The patient's father had an MI at age 23.  I advised him to start aspirin 81 mg daily.  He had a negative stress echo 1 year ago with no change in symptoms since that time.  Current medicines are reviewed with the patient today.  The patient does not have concerns regarding medicines.  Labs/ tests ordered today include:   Orders Placed This Encounter  Procedures  . EKG 12-Lead    Disposition:   FU one year  Signed, Tonny Bollman, MD  07/05/2017 5:39 PM    [facility] [address] [place], [place], [place]  [address] Phone: [phone]; Fax: [phone]

## 2017-09-07 DIAGNOSIS — R509 Fever, unspecified: Secondary | ICD-10-CM | POA: Diagnosis not present

## 2017-09-07 DIAGNOSIS — R05 Cough: Secondary | ICD-10-CM | POA: Diagnosis not present

## 2017-10-04 DIAGNOSIS — R198 Other specified symptoms and signs involving the digestive system and abdomen: Secondary | ICD-10-CM | POA: Diagnosis not present

## 2017-10-04 DIAGNOSIS — R5383 Other fatigue: Secondary | ICD-10-CM | POA: Diagnosis not present

## 2017-10-04 DIAGNOSIS — R195 Other fecal abnormalities: Secondary | ICD-10-CM | POA: Diagnosis not present

## 2018-01-25 DIAGNOSIS — I10 Essential (primary) hypertension: Secondary | ICD-10-CM | POA: Diagnosis not present

## 2018-01-25 DIAGNOSIS — Z125 Encounter for screening for malignant neoplasm of prostate: Secondary | ICD-10-CM | POA: Diagnosis not present

## 2018-01-25 DIAGNOSIS — Z0000 Encounter for general adult medical examination without abnormal findings: Secondary | ICD-10-CM | POA: Diagnosis not present

## 2018-01-25 DIAGNOSIS — R82998 Other abnormal findings in urine: Secondary | ICD-10-CM | POA: Diagnosis not present

## 2018-01-25 DIAGNOSIS — E7849 Other hyperlipidemia: Secondary | ICD-10-CM | POA: Diagnosis not present

## 2018-02-05 DIAGNOSIS — Z0000 Encounter for general adult medical examination without abnormal findings: Secondary | ICD-10-CM | POA: Diagnosis not present

## 2018-02-05 DIAGNOSIS — I351 Nonrheumatic aortic (valve) insufficiency: Secondary | ICD-10-CM | POA: Diagnosis not present

## 2018-02-05 DIAGNOSIS — Z1389 Encounter for screening for other disorder: Secondary | ICD-10-CM | POA: Diagnosis not present

## 2018-02-05 DIAGNOSIS — I10 Essential (primary) hypertension: Secondary | ICD-10-CM | POA: Diagnosis not present

## 2018-02-05 DIAGNOSIS — R945 Abnormal results of liver function studies: Secondary | ICD-10-CM | POA: Diagnosis not present

## 2018-06-02 IMAGING — CT CT HEART SCORING
2 series · 16 of 20 positions shown, 18 images · non-contrast
Comparison: None.

EXAM:
OVER-READ INTERPRETATION  CT CHEST

The following report is an over-read performed by radiologist Dr.
over-read does not include interpretation of cardiac or coronary
anatomy or pathology. The coronary calcium interpretation by the
cardiologist is attached.
CLINICAL DATA: Risk stratification
Coronary Calcium Score
MEDICATIONS:
None
TECHNIQUE: The patient was scanned on a Siemens Somatom 64 slice scanner. Axial
non-contrast 3 mm slices were carried out through the heart. The
data set was analyzed on a dedicated work station and scored using
the Agatson method.

[Series 2: casc 3.0 i36f 2 bestdiast 67 % · axial · 0.33mm/px · z∈[+879,+978]mm · 8 of 43 slices shown, 10 images]
[im 5/43  vessel]
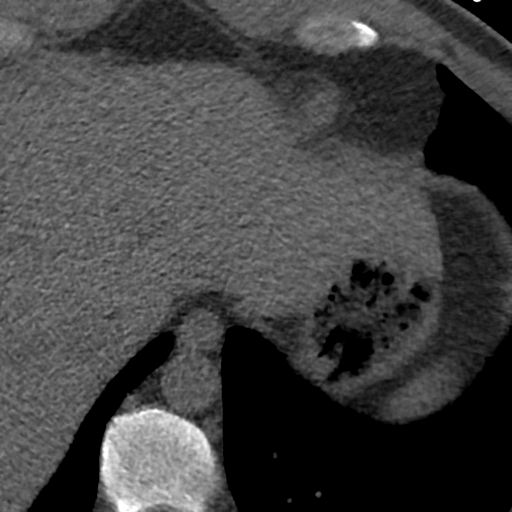
[im 5/43  lung]
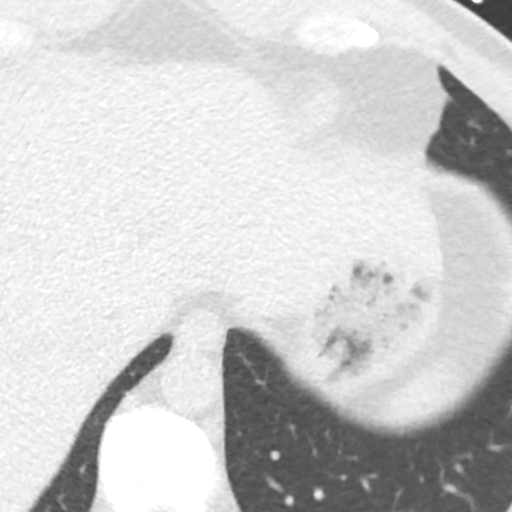
[im 10/43  vessel]
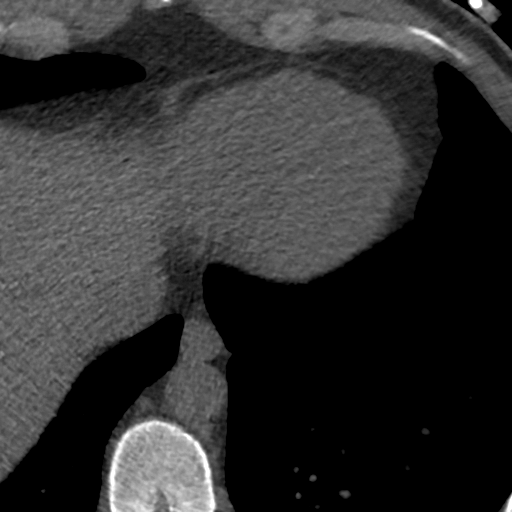
[im 15/43  vessel]
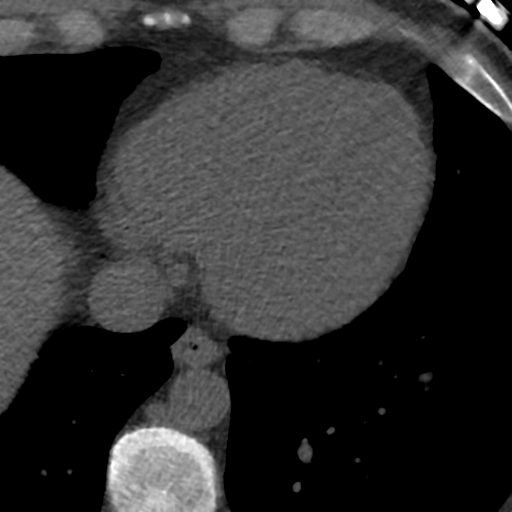
[im 19/43  vessel]
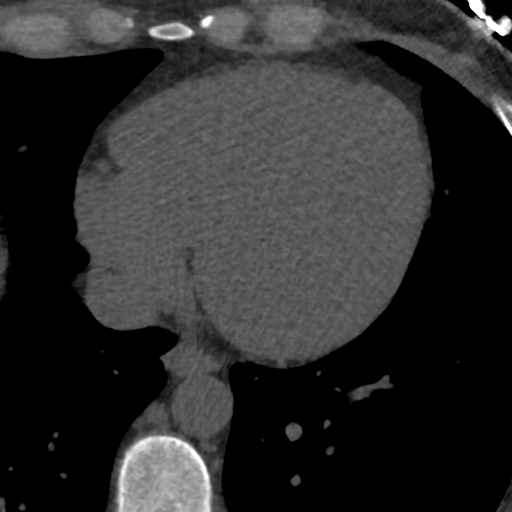
[im 24/43  vessel]
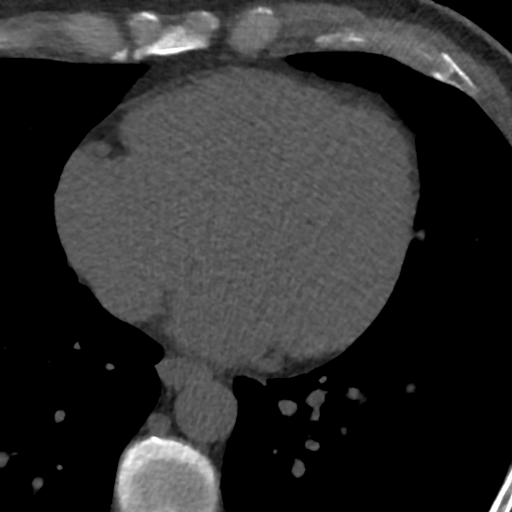
[im 24/43  lung]
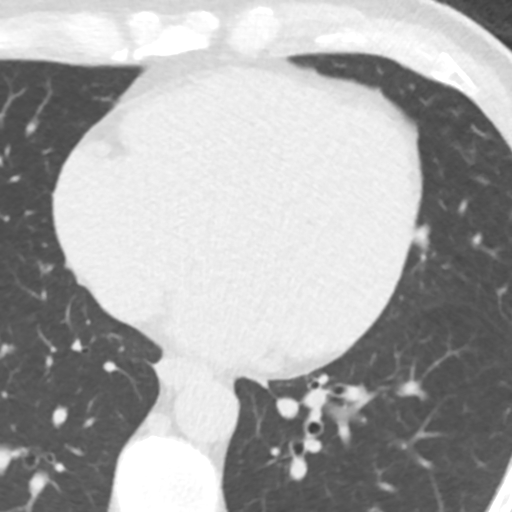
[im 29/43  vessel]
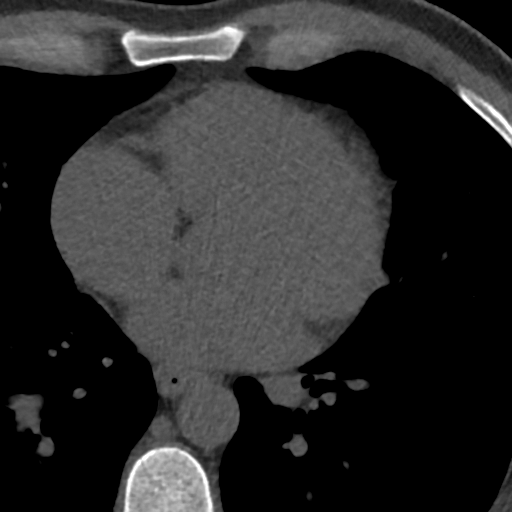
[im 33/43  vessel]
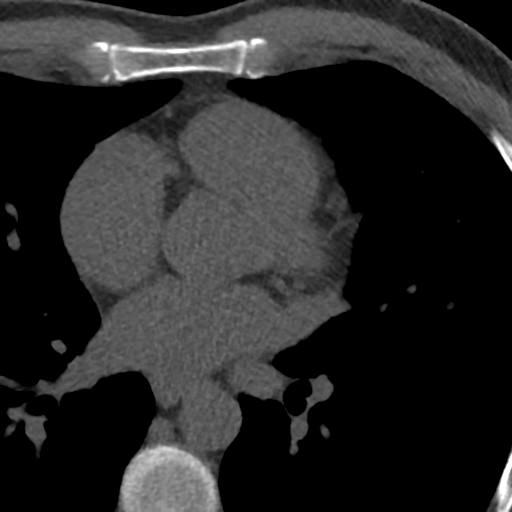
[im 38/43  vessel]
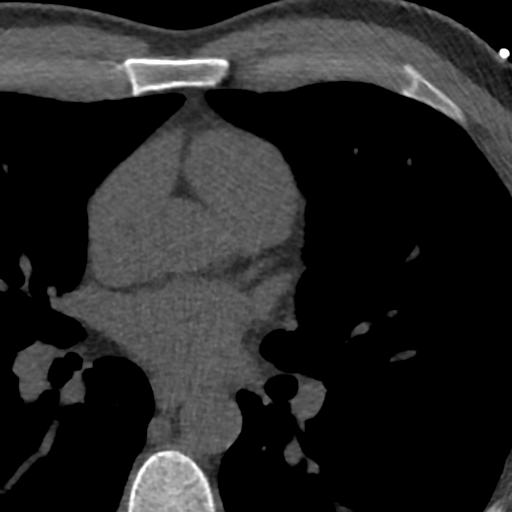

[Series 4: lung st 64 % · axial · 0.64mm/px · z∈[+878,+978]mm · 8 of 43 slices shown]
[im 5/43  lung]
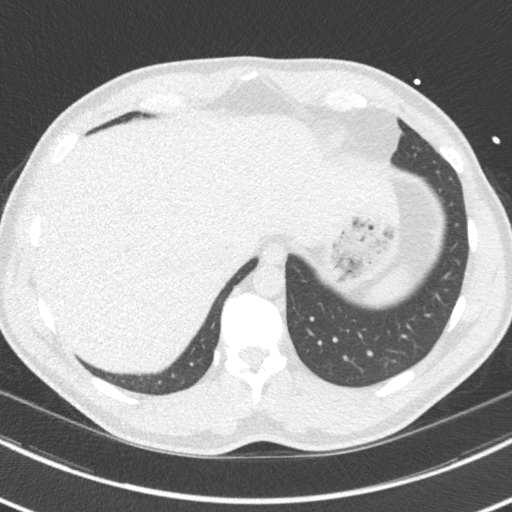
[im 10/43  lung]
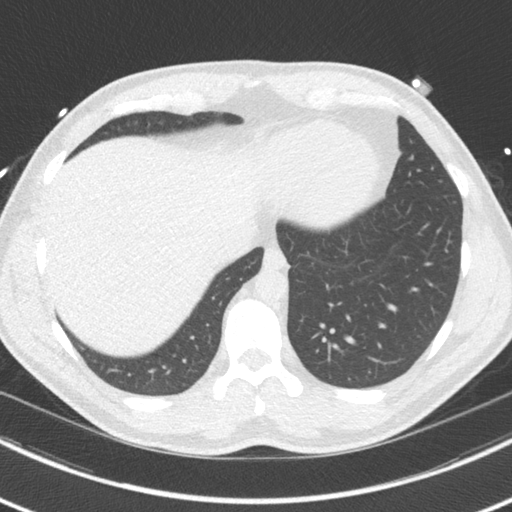
[im 15/43  lung]
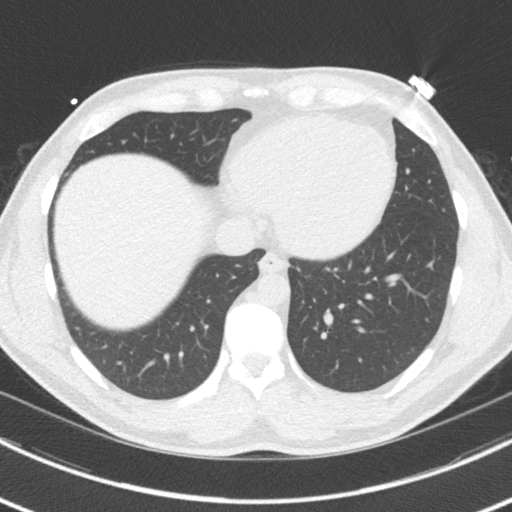
[im 19/43  lung]
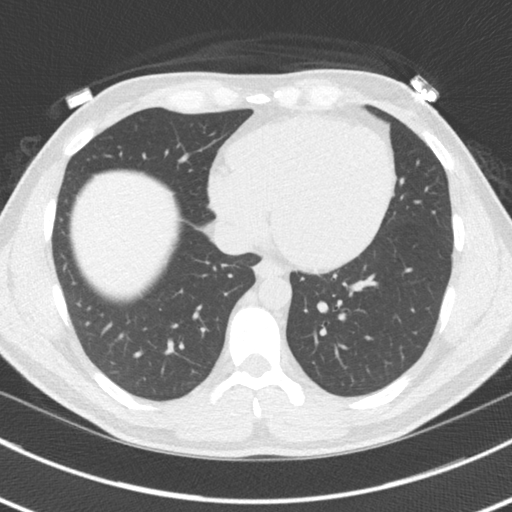
[im 24/43  lung]
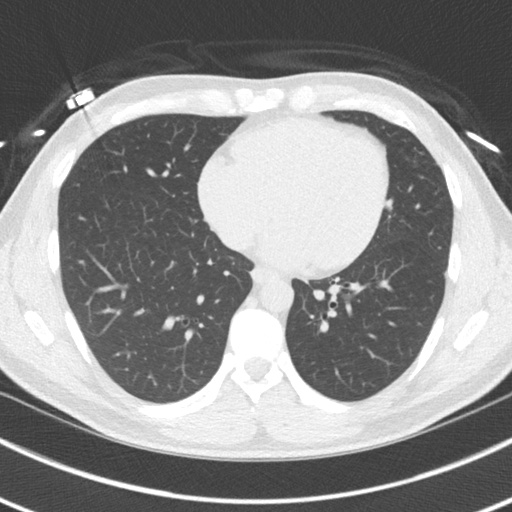
[im 29/43  lung]
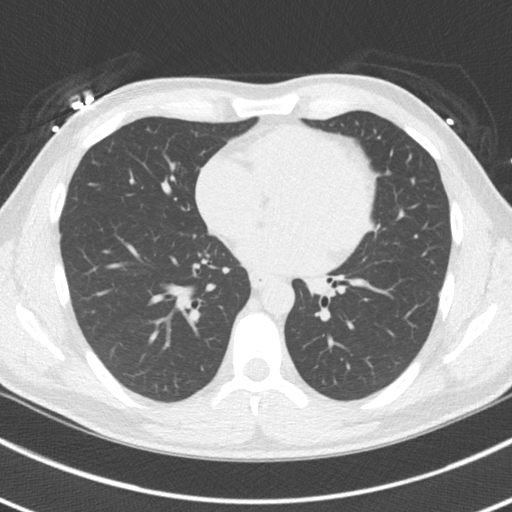
[im 33/43  lung]
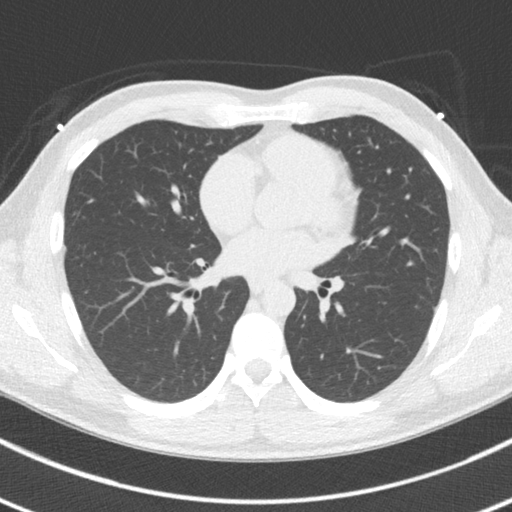
[im 38/43  lung]
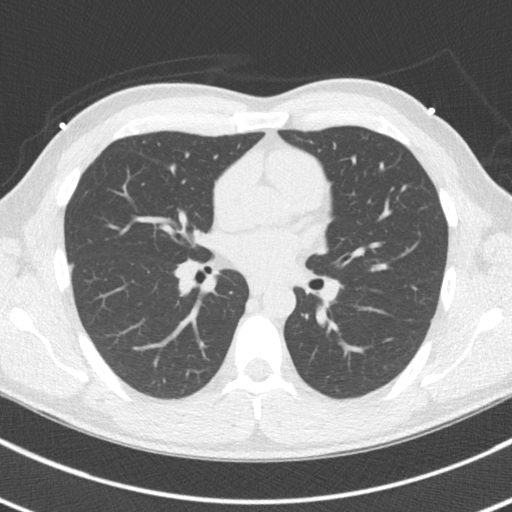

[16 of 20 positions shown; findings below may reference images not displayed]

FINDINGS: Within the visualized portions of the thorax there are no suspicious
appearing pulmonary nodules or masses, there is no acute
consolidative airspace disease, no pleural effusion, no pneumothorax
and no lymphadenopathy. Visualized portions of the upper abdomen are
unremarkable. There are no aggressive appearing lytic or blastic
lesions noted in the visualized portions of the skeleton.
IMPRESSION: 1. No significant incidental noncardiac findings are noted.
FINDINGS: Non-cardiac: See separate report from [REDACTED].

Ascending Aorta:  Normal size, no calcifications.

Pericardium: Normal.

Coronary arteries:  Normal origin.
IMPRESSION: Coronary calcium score of 0. This was 0 percentile for age and sex
matched control.

Immanuel Dial

## 2018-07-09 DIAGNOSIS — Z23 Encounter for immunization: Secondary | ICD-10-CM | POA: Diagnosis not present

## 2018-08-13 ENCOUNTER — Ambulatory Visit: Payer: 59 | Admitting: Neurology

## 2018-08-13 ENCOUNTER — Encounter: Payer: Self-pay | Admitting: Neurology

## 2018-08-13 VITALS — BP 124/72 | HR 80 | Ht 73.0 in | Wt 191.0 lb

## 2018-08-13 DIAGNOSIS — G478 Other sleep disorders: Secondary | ICD-10-CM | POA: Diagnosis not present

## 2018-08-13 DIAGNOSIS — R0681 Apnea, not elsewhere classified: Secondary | ICD-10-CM

## 2018-08-13 DIAGNOSIS — G4719 Other hypersomnia: Secondary | ICD-10-CM

## 2018-08-13 DIAGNOSIS — Z9189 Other specified personal risk factors, not elsewhere classified: Secondary | ICD-10-CM

## 2018-08-13 DIAGNOSIS — R0683 Snoring: Secondary | ICD-10-CM

## 2018-08-13 NOTE — Progress Notes (Signed)
Subjective:    Patient ID: Harry Molina is a 48 y.o. male.  HPI     Star Age, MD, PhD [facility] [address] [place], [place] [address]  Dear Dr. Dagmar Hait,    I saw your patient, Harry Molina, upon your kind request in my neurologic clinic today for evaluation of his sleep disturbance, The patient is unaccompanied today. I have met him once before over 3 years ago at which time there was concern for underlying obstructive sleep apnea. He had a negative home sleep test in January 2017. As you know, Harry Molina is a 48 year old right-handed gentleman with an underlying medical history of childhood celiac sprue, hypertension, hyperlipidemia, lactose intolerance, mitral valve prolapse, history of pyloric stenosis (status post surgery), prior spontaneous pneumothorax and borderline overweight state, who reports snoring and witnessed apneas by wife's report. I reviewed your office note from 02/05/2018, which you kindly included.he reports that his snoring has become worse. His Epworth sleepiness score is 14 out of 24 today, fatigue score is 38 out of 63. He also reports that during his prior home sleep test he did not sleep very well. He does not wake up rested. He denies morning headaches on nights night nocturia. He does report a family history of premature heart disease. He lives at home with his family which includes his wife and 3 sons, ages 23, 32 and 68. They have 1 cat and 1 dog, neither typically in their bedroom or the dog may sleep on the floor. Bedtime is around midnight and rise time between 6 and 7. He will take a nap when he has time or when sedentary. He does not watch TV in the bedroom. His weight has been stable.   Previously:   06/30/2015: 48 year old right-handed gentleman with an underlying medical history of hypertension, hyperlipidemia, lactose intolerance, childhood diagnosis of celiac disease, mitral valve prolapse, and  overweight state, who reports a long-standing history of snoring, excessive daytime somnolence as well as witnessed breathing pauses while asleep per wife's report. I reviewed your office note from 06/03/2015, which you kindly included. He works full-time. He lives at home with his wife and 3 children, ages 22, 62 and 83. He does not smoke. He drinks alcohol occasionally. He drinks caffeine in the form of sodas, 3-4 cans per day. His bedtime is usually past midnight. His rise time is between 6:30 and 7:45 AM. He does not wake up rested. He endorses occasional restless leg symptoms and restless movements of his legs. He has occasional dull morning headaches or heaviness in his head. He has occasional feeling of foggy headedness. He denies any significant nocturia. His Epworth sleepiness score is 13 out of 24 today, his fatigue score is 34 out of 63. He is not aware of any family history of OSA but his father snores heavily. There is history on his father's side of early cardiac disease. The patient has a previous history of spontaneous hemothorax when he was in college. His weight has been stable. He works full-time and is the president/CEO of a [employer]. Of note, he grinds his teeth at night. He has also talked to his dentist about potentially getting a bite guard versus a dental appliance for sleep apnea. He has been encouraged by his dentist to seek a sleep study as well.  His Past Medical History Is Significant For: Past Medical History:  Diagnosis Date  . Celiac sprue    Resolved at age 46-13  .  Hyperlipidemia   . Hypertension   . Lactose intolerance   . Mitral valve prolapse   . Pyloric stenosis   . Spontaneous pneumothorax    unsure side   His Past Surgical History Is Significant For: Past Surgical History:  Procedure Laterality Date  . BIOPSY STOMACH    . OPEN REDUCTION INTERNAL FIXATION (ORIF) PROXIMAL PHALANX Right 09/04/2015   Procedure: OPEN REDUCTION INTERNAL FIXATION (ORIF)  RIGHT SMALL FINGER PROXIMAL PHALANX;  Surgeon: Roseanne Kaufman, MD;  Location: [facility];  Service: Orthopedics;  Laterality: Right;  . pyloric stenosis      His Family History Is Significant For: Family History  Problem Relation Age of Onset  . Heart disease Father   . Hyperlipidemia Father   . Heart attack Father     His Social History Is Significant For: Social History   Socioeconomic History  . Marital status: Married    Spouse name: Not on file  . Number of children: 3  . Years of education: College  . Highest education level: Not on file  Occupational History  . Occupation: [employer]   Social Needs  . Financial resource strain: Not on file  . Food insecurity:    Worry: Not on file    Inability: Not on file  . Transportation needs:    Medical: Not on file    Non-medical: Not on file  Tobacco Use  . Smoking status: Never Smoker  . Smokeless tobacco: Current User    Types: Snuff  Substance and Sexual Activity  . Alcohol use: Yes    Alcohol/week: 0.0 standard drinks    Comment: social  . Drug use: No  . Sexual activity: Not on file  Lifestyle  . Physical activity:    Days per week: Not on file    Minutes per session: Not on file  . Stress: Not on file  Relationships  . Social connections:    Talks on phone: Not on file    Gets together: Not on file    Attends religious service: Not on file    Active member of club or organization: Not on file    Attends meetings of clubs or organizations: Not on file    Relationship status: Not on file  Other Topics Concern  . Not on file  Social History Narrative   Drinks about 3-4 diet cokes a day    His Allergies Are:  No Known Allergies:   His Current Medications Are:  Outpatient Encounter Medications as of 08/13/2018  Medication Sig  . aspirin EC 81 MG tablet Take 81 mg by mouth daily.  Marland Kitchen olmesartan (BENICAR) 40 MG tablet Take 20 mg by mouth daily.   . rosuvastatin (CRESTOR) 20 MG tablet Take 20 mg by mouth  daily.   No facility-administered encounter medications on file as of 08/13/2018.   :  Review of Systems:  Out of a complete 14 point review of systems, all are reviewed and negative with the exception of these symptoms as listed below: Review of Systems  Neurological:       Pt presents today to discuss his sleep. Pt had a HST in the past but did not sleep during the study. Pt's wife reports apnea and continued snoring.  Epworth Sleepiness Scale 0= would never doze 1= slight chance of dozing 2= moderate chance of dozing 3= high chance of dozing  Sitting and reading: 3 Watching TV: 1 Sitting inactive in a public place (ex. Theater or meeting): 2 As a passenger  in a car for an hour without a break: 3 Lying down to rest in the afternoon: 3 Sitting and talking to someone: 0 Sitting quietly after lunch (no alcohol): 2 In a car, while stopped in traffic: 0 Total: 14     Objective:  Neurological Exam  Physical Exam Physical Examination:   Vitals:   08/13/18 1516  BP: 124/72  Pulse: 80  SpO2: 98%   General Examination: The patient is a very pleasant 48 y.o. male in no acute distress. He appears well-developed and well-nourished and well groomed.   HEENT: Normocephalic, atraumatic, pupils are equal, round and reactive to light and accommodation. Funduscopic exam is normal with sharp disc margins noted. Extraocular tracking is good without limitation to gaze excursion or nystagmus noted. Normal smooth pursuit is noted. Hearing is grossly intact. Face is symmetric with normal facial animation and normal facial sensation. Speech is clear with no dysarthria noted. There is no hypophonia. There is no lip, neck/head, jaw or voice tremor. Neck is supple with full range of passive and active motion. There are no carotid bruits on auscultation. Oropharynx exam reveals: mild mouth dryness, good dental hygiene and mild airway crowding, due to narrow airway entry and tonsils in place. Mallampati  is class II. Tongue protrudes centrally and palate elevates symmetrically. Tonsils are small, neck size is 15 7/8 inches. He has a Mild overbite. Nasal inspection reveals no significant nasal mucosal bogginess or redness and no septal deviation.   Chest: Clear to auscultation without wheezing, rhonchi or crackles noted.  Heart: S1+S2+0, regular and normal without murmurs, rubs or gallops noted.   Abdomen: Soft, non-tender and non-distended with normal bowel sounds appreciated on auscultation.  Extremities: There is no pitting edema in the distal lower extremities bilaterally. Pedal pulses are intact.  Skin: Warm and dry without trophic changes noted. There are no varicose veins.  Musculoskeletal: exam reveals no obvious joint deformities, tenderness or joint swelling or erythema.   Neurologically:   Mental status: The patient is awake, alert and oriented in all 4 spheres. His immediate and remote memory, attention, language skills and fund of knowledge are appropriate. There is no evidence of aphasia, agnosia, apraxia or anomia. Speech is clear with normal prosody and enunciation. Thought process is linear. Mood is normal and affect is normal.  Cranial nerves II - XII are as described above under HEENT exam. In addition: shoulder shrug is normal with equal shoulder height noted. Motor exam: Normal bulk, strength and tone is noted. There is no drift, tremor or rebound. Romberg is negative. Fine motor skills and coordination: intact with normal finger taps, normal hand movements, normal rapid alternating patting, normal foot taps and normal foot agility.  Cerebellar testing: No dysmetria or intention tremor on finger to nose testing. Heel to shin is unremarkable bilaterally. There is no truncal or gait ataxia.  Sensory exam: intact to light touch, and temperature sense in the upper and lower extremities.  Gait, station and balance: He stands easily. No veering to one side is noted. No  leaning to one side is noted. Posture is age-appropriate and stance is narrow based. Gait shows normal stride length and normal pace. No problems turning are noted. He turns en bloc. Tandem walk is unremarkable.                Assessment and Plan:   In summary, Harry Molina is a very pleasant 48 year old male  with an underlying medical history of childhood celiac sprue, hypertension, hyperlipidemia, lactose  intolerance, mitral valve prolapse, history of pyloric stenosis (status post surgery), prior spontaneous pneumothorax and borderline overweight state, who presents for reevaluation of his sleep disorder, in particular, concern for underlying obstructive sleep apnea. He did have a negative home sleep test after 2 attempts in 2017 but also reports that he was not sleeping well at the time. He will benefit from reevaluation, ideally in the form of an attended sleep study.  I discussed the diagnosis of OSA, its prognosis and treatment options. We talked about medical treatments, surgical interventions and non-pharmacological approaches. I explained in particular the risks and ramifications of untreated moderate to severe OSA, especially with respect to developing cardiovascular disease down the Road, including congestive heart failure, difficult to treat hypertension, cardiac arrhythmias, or stroke. Even type 2 diabetes has, in part, been linked to untreated OSA. Symptoms of untreated OSA include daytime sleepiness, memory problems, mood irritability and mood disorder such as depression and anxiety, lack of energy, as well as recurrent headaches, especially morning headaches. We talked about trying to maintain a healthy lifestyle in general, as well as the importance of weight control. I encouraged the patient to eat healthy, exercise daily and keep well hydrated, to keep a scheduled bedtime and wake time routine, to not skip any meals and eat healthy snacks in between meals. I advised the patient not to  drive when feeling sleepy. I recommended the following at this time: sleep study with potential positive airway pressure titration. (We will score hypopneas at 4%).   I explained the sleep test procedure to the patient and also outlined possible surgical and non-surgical treatment options of OSA, including the use of a custom-made dental device or CPAP or autoPAP. He would be willing to try PAP therapy, if the need arises. I plan to see him back after the sleep study is completed and encouraged him to call with any interim questions, concerns, problems or updates.    Thank you very much for allowing me to participate in the care of this nice patient. If I can be of any further assistance to you please do not hesitate to call me at [phone].  Sincerely,   Star Age, MD, PhD

## 2018-08-13 NOTE — Patient Instructions (Addendum)
Thank you for choosing [facility] for your sleep related care! It was nice to see you again today! I appreciate that you entrust me with your sleep related healthcare concerns. I hope, I was able to address at least some of your concerns today, and that I can help you feel reassured and also get better.    Here is what we discussed today and what we came up with as our plan for you:    Based on your symptoms and your exam I believe you may be at risk for obstructive sleep apnea (aka OSA), and I think we should proceed with a sleep study to determine whether you do or do not have OSA and how severe it is. Even, if you have mild OSA, I may want you to consider treatment with CPAP or autoPAP, as treatment of even borderline or mild sleep apnea can result and improvement of symptoms such as sleep disruption, daytime sleepiness, nighttime bathroom breaks, restless leg symptoms, improvement of headache syndromes, even improved mood disorder.   Please remember, the long-term risks and ramifications of untreated moderate to severe obstructive sleep apnea are: increased Cardiovascular disease, including congestive heart failure, stroke, difficult to control hypertension, treatment resistant obesity, arrhythmias, especially irregular heartbeat commonly known as A. Fib. (atrial fibrillation); even type 2 diabetes has been linked to untreated OSA.   Sleep apnea can cause disruption of sleep and sleep deprivation in most cases, which, in turn, can cause recurrent headaches, problems with memory, mood, concentration, focus, and vigilance. Most people with untreated sleep apnea report excessive daytime sleepiness, which can affect their ability to drive. Please do not drive if you feel sleepy. Patients with sleep apnea developed difficulty initiating and maintaining sleep (aka insomnia).   Having sleep apnea may increase your risk for other sleep disorders, including involuntary behaviors sleep such  as sleep terrors, sleep talking, sleepwalking.    Having sleep apnea can also increase your risk for restless leg syndrome and leg movements at night.   Please note that untreated obstructive sleep apnea may carry additional perioperative morbidity. Patients with significant obstructive sleep apnea (typically, in the moderate to severe degree) should receive, if possible, perioperative PAP (positive airway pressure) therapy and the surgeons and particularly the anesthesiologists should be informed of the diagnosis and the severity of the sleep disordered breathing.   I will likely see you back after your sleep study to go over the test results and where to go from there. We will call you after your sleep study to advise about the results (most likely, you will hear from Anderson, my nurse) and to set up an appointment at the time, as necessary.    Our sleep lab administrative assistant will call you to schedule your sleep study and give you further instructions, regarding the check in process for the sleep study, arrival time, what to bring, when you can expect to leave after the study, etc., and to answer any other logistical questions you may have. If you don't hear back from her by about 2 weeks from now, please feel free to call her direct line at [phone] or you can call our general clinic number, or email [place] through My Chart.

## 2018-09-17 ENCOUNTER — Telehealth: Payer: Self-pay

## 2018-09-17 NOTE — Telephone Encounter (Signed)
Pt to call back sleep lab with new insurance information to submit sleep study requests.

## 2018-11-19 ENCOUNTER — Telehealth: Payer: Self-pay

## 2018-11-19 DIAGNOSIS — I351 Nonrheumatic aortic (valve) insufficiency: Secondary | ICD-10-CM

## 2018-11-19 NOTE — Telephone Encounter (Signed)
The patient is scheduled for OV with Dr. Excell Seltzer 5/4. Called him to reschedule to e-visit 4/23 due to COVID-19. The patient states he feels fine and wishes to postpone his visit until later.  He has an echo due in November. Will call the patient and schedule echo and office visit at that time. He was grateful for call and agrees with treatment plan.

## 2018-12-02 ENCOUNTER — Ambulatory Visit: Payer: 59 | Admitting: Cardiovascular Disease

## 2019-02-05 NOTE — Telephone Encounter (Signed)
Left message to call back to arrange echo and OV with Dr. Burt Knack in November.

## 2019-02-06 NOTE — Addendum Note (Signed)
Addended by: Harland German A on: 02/06/2019 03:11 PM   Modules accepted: Orders

## 2019-02-06 NOTE — Telephone Encounter (Signed)
Scheduled patient for 2 year echo and overdue 1 year OV with Dr. Burt Knack on 11/23. The patient was grateful for call and agrees with treatment plan.

## 2019-06-23 ENCOUNTER — Ambulatory Visit (HOSPITAL_COMMUNITY): Payer: 59 | Attending: Cardiovascular Disease

## 2019-06-23 ENCOUNTER — Ambulatory Visit (INDEPENDENT_AMBULATORY_CARE_PROVIDER_SITE_OTHER): Payer: 59 | Admitting: Cardiovascular Disease

## 2019-06-23 ENCOUNTER — Encounter: Payer: Self-pay | Admitting: Cardiovascular Disease

## 2019-06-23 ENCOUNTER — Other Ambulatory Visit: Payer: Self-pay

## 2019-06-23 VITALS — BP 126/80 | HR 76 | Ht 73.0 in | Wt 197.0 lb

## 2019-06-23 DIAGNOSIS — I351 Nonrheumatic aortic (valve) insufficiency: Secondary | ICD-10-CM | POA: Insufficient documentation

## 2019-06-23 DIAGNOSIS — R079 Chest pain, unspecified: Secondary | ICD-10-CM

## 2019-06-23 MED ORDER — METOPROLOL TARTRATE 100 MG PO TABS: ORAL_TABLET | ORAL | 0 refills | Status: DC

## 2019-06-23 NOTE — Patient Instructions (Addendum)
Medication Instructions:  Your provider recommends that you continue on your current medications as directed. Please refer to the Current Medication list given to you today.    Labwork: You will have labs drawn prior to your crdiac CT.  Testing/Procedures: Dr. Burt Knack recommends you have a cardiac CT. Instructions are below.  Follow-Up: You will be called to scheduled your 2 year echo and office visit (this will be around November 2022).  Any Other Special Instructions Will Be Listed Below (If Applicable).  CARDIAC CT INSTRUCTIONS: Your cardiac CT will be scheduled at one of the below locations:   [facility] [address] [place], [place] [address] [phone] [place] [address] [place], [place] [address] [phone]  If scheduled at [facility], please arrive at the [facility] main entrance of [facility] 30-45 minutes prior to test start time. Proceed to the [facility] Radiology Department (first floor) to check-in and test prep.  If scheduled at [facility], please arrive 15 mins early for check-in and test prep.  Please follow these instructions carefully (unless otherwise directed):  Hold all erectile dysfunction medications at least 3 days (72 hrs) prior to test.  On the Night Before the Test: . Be sure to Drink plenty of water. . Do not consume any caffeinated/decaffeinated beverages or chocolate 12 hours prior to your test. . Do not take any antihistamines 12 hours prior to your test.  On the Day of the Test: . Drink plenty of water. Do not drink any water within one hour of the test. . Do not eat any food 4 hours prior to the test. . You may take your regular medications prior to the test.  . Take metoprolol (Lopressor) 100 mg two hours prior to test.  After the Test: . Drink plenty of water. . After receiving IV contrast, you may experience  a mild flushed feeling. This is normal. . On occasion, you may experience a mild rash up to 24 hours after the test. This is not dangerous. If this occurs, you can take Benadryl 25 mg and increase your fluid intake. . If you experience trouble breathing, this can be serious. If it is severe call 911 IMMEDIATELY. If it is mild, please call our office.   Once we have confirmed authorization from your insurance company, we will call you to set up a date and time for your test.   For non-scheduling related questions, please contact the cardiac imaging nurse navigator should you have any questions/concerns: Marchia Bond, RN Navigator Cardiac Imaging [facility] [phone] Office

## 2019-06-23 NOTE — Progress Notes (Signed)
Cardiology Office Note:    Date:  06/23/2019   ID:  Harry Molina, DOB August 23, 1970, MRN [id number]  PCP:  Chilton Greathouse, MD  Cardiologist:  Tonny Bollman, MD  Electrophysiologist:  None   Referring MD: Chilton Greathouse, MD   Chief Complaint  Patient presents with  . Chest Pain    History of Present Illness:    Harry Molina is a 48 y.o. male with a strong family history of CAD who presents for follow-up evaluation. His father, who is a patient of mine, had his first MI in his 25's.   The patient underwent testing in 2017, including a coronary Ca CT, echo, and exercise treadmill. He had a calcium score of zero, normal treadmill study, and normal echo with the exception of mild aortic valve insufficiency. He had a repeat echo in 2018 showing stable, mild aortic insufficiency.   Today he is here alone. He has intermittent chest pains, primarily at rest. Describes these as left sided, sometimes sharp and other times dull. He does not typically experience these pains with exertion. He does complain of exertional dyspnea and feels like it doesn't take much for him to become deconditioned. He denies lightheadedness, syncope, or leg swelling.   Past Medical History:  Diagnosis Date  . Celiac sprue    Resolved at age 69-13  . Hyperlipidemia   . Hypertension   . Lactose intolerance   . Mitral valve prolapse   . Pyloric stenosis   . Spontaneous pneumothorax    unsure side    Past Surgical History:  Procedure Laterality Date  . BIOPSY STOMACH    . OPEN REDUCTION INTERNAL FIXATION (ORIF) PROXIMAL PHALANX Right 09/04/2015   Procedure: OPEN REDUCTION INTERNAL FIXATION (ORIF) RIGHT SMALL FINGER PROXIMAL PHALANX;  Surgeon: Dominica Severin, MD;  Location: [facility] OR;  Service: Orthopedics;  Laterality: Right;  . pyloric stenosis      Current Medications: Current Meds  Medication Sig  . aspirin EC 81 MG tablet Take 81 mg by mouth daily.  Marland Kitchen olmesartan (BENICAR) 40 MG tablet Take  20 mg by mouth daily.   . rosuvastatin (CRESTOR) 20 MG tablet Take 20 mg by mouth daily.     Allergies:   Patient has no known allergies.   Social History   Socioeconomic History  . Marital status: Married    Spouse name: Not on file  . Number of children: 3  . Years of education: College  . Highest education level: Not on file  Occupational History  . Occupation: [employer]   Social Needs  . Financial resource strain: Not on file  . Food insecurity    Worry: Not on file    Inability: Not on file  . Transportation needs    Medical: Not on file    Non-medical: Not on file  Tobacco Use  . Smoking status: Never Smoker  . Smokeless tobacco: Current User    Types: Snuff  Substance and Sexual Activity  . Alcohol use: Yes    Alcohol/week: 0.0 standard drinks    Comment: social  . Drug use: No  . Sexual activity: Not on file  Lifestyle  . Physical activity    Days per week: Not on file    Minutes per session: Not on file  . Stress: Not on file  Relationships  . Social Musician on phone: Not on file    Gets together: Not on file    Attends religious service: Not on file  Active member of club or organization: Not on file    Attends meetings of clubs or organizations: Not on file    Relationship status: Not on file  Other Topics Concern  . Not on file  Social History Narrative   Drinks about 3-4 diet cokes a day      Family History: The patient's family history includes Heart attack in his father; Heart disease in his father; Hyperlipidemia in his father.  ROS:   Please see the history of present illness.    All other systems reviewed and are negative.  EKGs/Labs/Other Studies Reviewed:    The following studies were reviewed today: Coronary CTA: FINDINGS: Non-cardiac: See separate report from [facility].  Ascending Aorta:  Normal size, no calcifications.  Pericardium: Normal.  Coronary arteries:  Normal origin.  IMPRESSION:  Coronary calcium score of 0. This was 0 percentile for age and sex matched control.  Stress EKG 03/15/2016: Study Highlights    Blood pressure demonstrated a normal response to exercise.  Horizontal ST segment depression ST segment depression of 1 mm was noted during stress in the II and III leads.   Positive ETT with a mm of horizonatal ST depression In leads 2,3 peak stress    Stress Echo 04/20/2016: Study Conclusions  - Procedure narrative: Transthoracic stress echocardiography for   chest pain evaluation. Images were captured at baseline and peak   exercise. Intravenous contrast (Definity) was administered. - Stress ECG conclusions: The stress ECG was normal. Duke scoring:   ; ; ; resulting score is 11. This score predicts a low risk of   cardiac events.  Impressions:  - Normal study after maximal exercise.  EKG:  EKG is ordered today.  The ekg ordered today demonstrates NSR 76 bpm, within normal limits  Recent Labs: No results found for requested labs within last 8760 hours.  Recent Lipid Panel No results found for: CHOL, TRIG, HDL, CHOLHDL, VLDL, LDLCALC, LDLDIRECT  Physical Exam:    VS:  BP 126/80   Pulse 76   Ht 6\' 1"  (1.854 m)   Wt 197 lb (89.4 kg)   SpO2 98%   BMI 25.99 kg/m     Wt Readings from Last 3 Encounters:  06/23/19 197 lb (89.4 kg)  08/13/18 191 lb (86.6 kg)  07/05/17 192 lb 12.8 oz (87.5 kg)     GEN:  Well nourished, well developed in no acute distress HEENT: Normal NECK: No JVD; No carotid bruits LYMPHATICS: No lymphadenopathy CARDIAC: RRR, no murmurs, rubs, gallops RESPIRATORY:  Clear to auscultation without rales, wheezing or rhonchi  ABDOMEN: Soft, non-tender, non-distended MUSCULOSKELETAL:  No edema; No deformity  SKIN: Warm and dry NEUROLOGIC:  Alert and oriented x 3 PSYCHIATRIC:  Normal affect   ASSESSMENT:    1. Aortic valve insufficiency, etiology of cardiac valve disease unspecified   2. Chest pain at rest   3.  Chest pain, unspecified type    PLAN:    In order of problems listed above:  1. Today's echo is reviewed and shows normal LV function, normal LV chamber size, and stable mild AI. Will follow-up in 2 years. 2. Because of his strong family history with his father having an MI in his 66's, I have recommended a coronary CTA for further evaluation. I think this will also help with aggressiveness of medical therapy. He is known to have a false positive exercise treadmill study based on previous testing.    Medication Adjustments/Labs and Tests Ordered: Current medicines are reviewed at  length with the patient today.  Concerns regarding medicines are outlined above.  Orders Placed This Encounter  Procedures  . CT CORONARY MORPH W/CTA COR W/SCORE W/CA W/CM &/OR WO/CM  . CT CORONARY FRACTIONAL FLOW RESERVE DATA PREP  . CT CORONARY FRACTIONAL FLOW RESERVE FLUID ANALYSIS  . Basic Metabolic Panel (BMET)  . EKG 12-Lead  . ECHOCARDIOGRAM COMPLETE   Meds ordered this encounter  Medications  . metoprolol tartrate (LOPRESSOR) 100 MG tablet    Sig: Take 1 tablet (100mg ) 1-2 hours prior to your cardiac CT scan.    Dispense:  1 tablet    Refill:  0    Patient Instructions  Medication Instructions:  Your provider recommends that you continue on your current medications as directed. Please refer to the Current Medication list given to you today.    Labwork: You will have labs drawn prior to your crdiac CT.  Testing/Procedures: Dr. Excell Seltzerooper recommends you have a cardiac CT. Instructions are below.  Follow-Up: You will be called to scheduled your 2 year echo and office visit (this will be around November 2022).  Any Other Special Instructions Will Be Listed Below (If Applicable).  CARDIAC CT INSTRUCTIONS: Your cardiac CT will be scheduled at one of the below locations:   [facility] [address] [place], [place] [address] [phone]  OR  [facility] [address] [place], [place] [address] [phone]  If scheduled at [facility], please arrive at the [facility] main entrance of [facility] 30-45 minutes prior to test start time. Proceed to the [facility] Department (first floor) to check-in and test prep.  If scheduled at [facility], please arrive 15 mins early for check-in and test prep.  Please follow these instructions carefully (unless otherwise directed):  Hold all erectile dysfunction medications at least 3 days (72 hrs) prior to test.  On the Night Before the Test: . Be sure to Drink plenty of water. . Do not consume any caffeinated/decaffeinated beverages or chocolate 12 hours prior to your test. . Do not take any antihistamines 12 hours prior to your test.  On the Day of the Test: . Drink plenty of water. Do not drink any water within one hour of the test. . Do not eat any food 4 hours prior to the test. . You may take your regular medications prior to the test.  . Take metoprolol (Lopressor) 100 mg two hours prior to test.  After the Test: . Drink plenty of water. . After receiving IV contrast, you may experience a mild flushed feeling. This is normal. . On occasion, you may experience a mild rash up to 24 hours after the test. This is not dangerous. If this occurs, you can take Benadryl 25 mg and increase your fluid intake. . If you experience trouble breathing, this can be serious. If it is severe call 911 IMMEDIATELY. If it is mild, please call our office.   Once we have confirmed authorization from your insurance company, we will call you to set up a date and time for your test.   For non-scheduling related questions, please contact the cardiac imaging nurse navigator should you have any questions/concerns: Rockwell AlexandriaSara Wallace, RN Navigator Cardiac Imaging [facility] [phone] Office     Signed,  Tonny BollmanMichael Finola Rosal, MD  06/23/2019 4:23 PM    [facility]

## 2019-08-15 ENCOUNTER — Ambulatory Visit (HOSPITAL_COMMUNITY)
Admission: RE | Admit: 2019-08-15 | Discharge: 2019-08-15 | Disposition: A | Discharge status: Home / Self Care | Payer: 59 | Source: Ambulatory Visit | Attending: Cardiovascular Disease | Admitting: Cardiovascular Disease

## 2019-08-15 ENCOUNTER — Other Ambulatory Visit: Payer: Self-pay

## 2019-08-15 DIAGNOSIS — R079 Chest pain, unspecified: Secondary | ICD-10-CM | POA: Insufficient documentation

## 2019-08-15 DIAGNOSIS — Z006 Encounter for examination for normal comparison and control in clinical research program: Secondary | ICD-10-CM

## 2019-08-15 MED ORDER — IOHEXOL 350 MG/ML SOLN
80.0000 mL | Freq: Once | INTRAVENOUS | Status: AC | PRN
  Administered 2019-08-15: 80 mL via INTRAVENOUS

## 2019-08-15 MED ORDER — METOPROLOL TARTRATE 5 MG/5ML IV SOLN: 5.0000 mg | INTRAVENOUS | Status: DC | PRN

## 2019-08-15 MED ORDER — METOPROLOL TARTRATE 5 MG/5ML IV SOLN
INTRAVENOUS | Status: AC
  Filled 2019-08-15: qty 15

## 2019-08-15 MED ORDER — NITROGLYCERIN 0.4 MG SL SUBL
SUBLINGUAL_TABLET | SUBLINGUAL | Status: AC
  Administered 2019-08-15: 0.8 mg via SUBLINGUAL
  Filled 2019-08-15: qty 2

## 2019-08-15 MED ORDER — NITROGLYCERIN 0.4 MG SL SUBL: 0.8000 mg | SUBLINGUAL_TABLET | Freq: Once | SUBLINGUAL | Status: AC

## 2019-08-15 NOTE — Research (Signed)
Subject Name: Harry Molina  Subject met inclusion and exclusion criteria.  The informed consent form, study requirements and expectations were reviewed with the subject and questions and concerns were addressed prior to the signing of the consent form.  The subject verbalized understanding of the trial requirements.  The subject agreed to participate in the CADFEM 4 trial and signed the informed consent at 1239 on 08/15/19  The informed consent was obtained prior to performance of any protocol-specific procedures for the subject.  A copy of the signed informed consent was given to the subject and a copy was placed in the subject's medical record.   Timoteo Gaul

## 2019-11-13 ENCOUNTER — Telehealth: Payer: Self-pay | Admitting: *Deleted

## 2019-11-13 NOTE — Telephone Encounter (Signed)
Called patient for 90 day phone call follow up for CADFEM study. Left message for him to call me back.      [place] Perel Hauschild   11/13/2019 15:43 p.m.

## 2020-06-07 ENCOUNTER — Encounter: Payer: Self-pay | Admitting: Physician Assistant

## 2020-06-22 ENCOUNTER — Ambulatory Visit: Payer: 59 | Admitting: Physician Assistant

## 2020-06-22 ENCOUNTER — Encounter: Payer: Self-pay | Admitting: Physician Assistant

## 2020-06-22 VITALS — BP 120/80 | HR 76 | Ht 73.0 in | Wt 191.2 lb

## 2020-06-22 DIAGNOSIS — Z8719 Personal history of other diseases of the digestive system: Secondary | ICD-10-CM | POA: Diagnosis not present

## 2020-06-22 DIAGNOSIS — R1084 Generalized abdominal pain: Secondary | ICD-10-CM | POA: Diagnosis not present

## 2020-06-22 DIAGNOSIS — R197 Diarrhea, unspecified: Secondary | ICD-10-CM

## 2020-06-22 MED ORDER — SUTAB 1479-225-188 MG PO TABS: 1.0000 | ORAL_TABLET | Freq: Once | ORAL | 0 refills | Status: AC

## 2020-06-22 NOTE — Progress Notes (Signed)
Chief Complaint: IBS, Abdominal pain  HPI:    Harry Molina is a 49 year old male with a past medical history as listed below, who was referred to me by Chilton Greathouse, MD for a complaint of IBS and abdominal pain.    02/13/2020 CMP normal.  CBC normal.  TSH normal.    02/17/2020 Hemosure negative.    Today, the patient presents to clinic and tells me that he has had stomach issues since he was a young child.  Apparently diagnosed with celiac disease as a child and went on a strict diet and was fine for years, apparently moving here 15 years ago he was seen at [facility] for follow-up for celiac disease and they did small bowel biopsies which showed that the "villi had recovered".  He was placed on a regular diet and they repeated an EGD a year later and everything still look good so they told him not to worry about it anymore.  Tells me that he does have abdominal symptoms on a daily basis including gas and bloating and cramping as well as looser stools than normal.  Tells me this is mostly dependent on what he eats.  He is also lactose intolerant but does not abide by a completely lactose-free diet.  Explained that most of this is chronic for him and his wife is now concerned.    Denies fever, chills, blood in his stool or symptoms that awaken him from sleep.  Past Medical History:  Diagnosis Date  . Celiac sprue    Resolved at age 39-13  . Hyperlipidemia   . Hypertension   . Lactose intolerance   . Mitral valve prolapse   . Pyloric stenosis   . Spontaneous pneumothorax    unsure side    Past Surgical History:  Procedure Laterality Date  . BIOPSY STOMACH    . OPEN REDUCTION INTERNAL FIXATION (ORIF) PROXIMAL PHALANX Right 09/04/2015   Procedure: OPEN REDUCTION INTERNAL FIXATION (ORIF) RIGHT SMALL FINGER PROXIMAL PHALANX;  Surgeon: Dominica Severin, MD;  Location: [facility] OR;  Service: Orthopedics;  Laterality: Right;  . pyloric stenosis      Current Outpatient Medications  Medication Sig  Dispense Refill  . aspirin EC 81 MG tablet Take 81 mg by mouth daily.    . metoprolol tartrate (LOPRESSOR) 100 MG tablet Take 1 tablet (100mg ) 1-2 hours prior to your cardiac CT scan. 1 tablet 0  . olmesartan (BENICAR) 40 MG tablet Take 20 mg by mouth daily.     . rosuvastatin (CRESTOR) 20 MG tablet Take 20 mg by mouth daily.     No current facility-administered medications for this visit.    Allergies as of 06/22/2020  . (No Known Allergies)    Family History  Problem Relation Age of Onset  . Heart disease Father   . Hyperlipidemia Father   . Heart attack Father     Social History   Socioeconomic History  . Marital status: Married    Spouse name: Not on file  . Number of children: 3  . Years of education: College  . Highest education level: Not on file  Occupational History  . Occupation: [employer]   Tobacco Use  . Smoking status: Never Smoker  . Smokeless tobacco: Current User    Types: Snuff  Substance and Sexual Activity  . Alcohol use: Yes    Alcohol/week: 0.0 standard drinks    Comment: social  . Drug use: No  . Sexual activity: Not on file  Other Topics  Concern  . Not on file  Social History Narrative   Drinks about 3-4 diet cokes a day    Social Determinants of Health   Financial Resource Strain:   . Difficulty of Paying Living Expenses: Not on file  Food Insecurity:   . Worried About Programme researcher, broadcasting/film/video in the Last Year: Not on file  . Ran Out of Food in the Last Year: Not on file  Transportation Needs:   . Lack of Transportation (Medical): Not on file  . Lack of Transportation (Non-Medical): Not on file  Physical Activity:   . Days of Exercise per Week: Not on file  . Minutes of Exercise per Session: Not on file  Stress:   . Feeling of Stress : Not on file  Social Connections:   . Frequency of Communication with Friends and Family: Not on file  . Frequency of Social Gatherings with Friends and Family: Not on file  . Attends Religious  Services: Not on file  . Active Member of Clubs or Organizations: Not on file  . Attends Banker Meetings: Not on file  . Marital Status: Not on file  Intimate Partner Violence:   . Fear of Current or Ex-Partner: Not on file  . Emotionally Abused: Not on file  . Physically Abused: Not on file  . Sexually Abused: Not on file    Review of Systems:    Constitutional: No weight loss, fever or chills Skin: No rash  Cardiovascular: No chest pain Respiratory: No SOB  Gastrointestinal: See HPI and otherwise negative Genitourinary: No dysuria  Neurological: No headache, dizziness or syncope Musculoskeletal: No new muscle or joint pain Hematologic: No bleeding Psychiatric: No history of depression or anxiety   Physical Exam:  Vital signs: BP 120/80   Pulse 76   Ht 6\' 1"  (1.854 m)   Wt 191 lb 3.2 oz (86.7 kg)   SpO2 98%   BMI 25.23 kg/m   Constitutional:   Pleasant Caucasian male appears to be in NAD, Well developed, Well nourished, alert and cooperative Head:  Normocephalic and atraumatic. Eyes:   PEERL, EOMI. No icterus. Conjunctiva pink. Ears:  Normal auditory acuity. Neck:  Supple Throat: Oral cavity and pharynx without inflammation, swelling or lesion.  Respiratory: Respirations even and unlabored. Lungs clear to auscultation bilaterally.   No wheezes, crackles, or rhonchi.  Cardiovascular: Normal S1, S2. No MRG. Regular rate and rhythm. No peripheral edema, cyanosis or pallor.  Gastrointestinal:  Soft, nondistended, nontender. No rebound or guarding. Normal bowel sounds. No appreciable masses or hepatomegaly. Rectal:  Not performed.  Msk:  Symmetrical without gross deformities. Without edema, no deformity or joint abnormality.  Neurologic:  Alert and  oriented x4;  grossly normal neurologically.  Skin:   Dry and intact without significant lesions or rashes. Psychiatric:  Demonstrates good judgement and reason without abnormal affect or behaviors.  See HPI for  recent labs.  Assessment: 1.  Generalized abdominal pain: Cramping pains, typically after eating with bloating sometimes nausea; consider relation to history of celiac disease versus IBS versus other 2.  Diarrhea 3.  History of celiac disease  Plan: 1.  Scheduled the patient for diagnostic EGD due to history of celiac disease and colonoscopy with Dr. in the [facility].  Did provide the patient with a detailed handout regarding risk for the procedures and he agrees to proceed.  He has had both of his Covid vaccines. 2.  Patient tells me that he is "not that bothered" by all  of the symptoms as they are chronic for him but his wife is concerned.  At this time we will not start any medications, but could discuss some antispasmodics, etc. in the future pending results of procedures if he would like. 3.  Patient to follow in clinic per recommendations from Dr. Meridee Score after time procedures.  Hyacinth Meeker, PA-C [facility] 06/22/2020, 9:48 AM  Cc: Chilton Greathouse, MD

## 2020-06-22 NOTE — Progress Notes (Signed)
Attending Physician's Attestation   I have reviewed the chart.   I agree with the Advanced Practitioner's note, impression, and recommendations with any updates as below. Agree with endoscopic evaluation.  Would recommend rechecking TTG-IgA and IgA level if they have not been done within the last year to get a sense of what that looks like since he does not follow a GFD at this time.   Corliss Parish, MD [facility] Advanced Endoscopy Office # [phone]

## 2020-06-22 NOTE — Patient Instructions (Signed)
If you are age 49 or older, your body mass index should be between 23-30. Your Body mass index is 25.23 kg/m. If this is out of the aforementioned range listed, please consider follow up with your Primary Care Provider.  If you are age 67 or younger, your body mass index should be between 19-25. Your Body mass index is 25.23 kg/m. If this is out of the aformentioned range listed, please consider follow up with your Primary Care Provider.   You have been scheduled for an endoscopy and colonoscopy. Please follow the written instructions given to you at your visit today. Please pick up your prep supplies at the pharmacy within the next 1-3 days. If you use inhalers (even only as needed), please bring them with you on the day of your procedure.  Due to recent changes in healthcare laws, you may see the results of your imaging and laboratory studies on MyChart before your provider has had a chance to review them.  We understand that in some cases there may be results that are confusing or concerning to you. Not all laboratory results come back in the same time frame and the provider may be waiting for multiple results in order to interpret others.  Please give [place] 48 hours in order for your provider to thoroughly review all the results before contacting the office for clarification of your results.

## 2020-06-22 NOTE — Addendum Note (Signed)
Addended by: Mariane Duval on: 06/22/2020 03:12 PM   Modules accepted: Orders

## 2020-06-22 NOTE — Progress Notes (Signed)
Labs added. Left message for patient to call office.

## 2020-06-23 NOTE — Progress Notes (Signed)
Patient informed to stop by the office to have labs drawn.

## 2020-07-02 ENCOUNTER — Other Ambulatory Visit: Payer: 59

## 2020-07-02 DIAGNOSIS — R1084 Generalized abdominal pain: Secondary | ICD-10-CM

## 2020-07-02 DIAGNOSIS — R197 Diarrhea, unspecified: Secondary | ICD-10-CM

## 2020-07-02 DIAGNOSIS — Z8719 Personal history of other diseases of the digestive system: Secondary | ICD-10-CM

## 2020-07-02 NOTE — Addendum Note (Signed)
Addended by: Miguel Aschoff on: 07/02/2020 03:47 PM   Modules accepted: Orders

## 2020-07-05 LAB — IGA: Immunoglobulin A: 96 mg/dL (ref 47–310)

## 2020-07-05 LAB — TISSUE TRANSGLUTAMINASE ABS,IGG,IGA
(tTG) Ab, IgA: <1 U/mL
(tTG) Ab, IgG: <1 U/mL

## 2020-08-24 ENCOUNTER — Encounter: Payer: Self-pay | Admitting: Gastroenterology

## 2020-08-27 ENCOUNTER — Telehealth: Payer: Self-pay | Admitting: Cardiovascular Disease

## 2020-08-27 NOTE — Telephone Encounter (Signed)
Pt c/o of Chest Pain: STAT if CP now or developed within 24 hours  1. Are you having CP right now? no  2. Are you experiencing any other symptoms (ex. SOB, nausea, vomiting, sweating)? no  3. How long have you been experiencing CP? Last few weeks  4. Is your CP continuous or coming and going? Comes and goes   5. Have you taken Nitroglycerin? No   Patient states he has been having chest pain for the last few weeks. He states he is not having chest pain now. She states it comes and goes. He states he has an echo every 2 years and would like to know if he needs to have one sooner due to the symptoms.    ?

## 2020-08-27 NOTE — Telephone Encounter (Signed)
Pt called to report that he has been having Chest pressure for about 4 weeks on and off.... he says it happens at rest and with exertion except his last cardio workout was about 2 weeks ago on a bike ride and he did not have any symptoms. He has not exercised since out of worry that he has been having the discomfort and his significant family history.   He reports that earlier this week he felt the pressure in his left shoulder. He says that it seems to also worsen later in the day.   Pt has recently restarted his ASA 81 mg.   Pt had a 50 year old paternal cousin that died this week from an MI in [place].   This has made him even more concerned and he would like to know what Dr. Excell Seltzer recommends.   His last CT  Tonny Bollman, MD  08/17/2019 11:56 AM EST      Normal study noted. No CAD    Will forward to Dr. Excell Seltzer for his review and recommendations.   Pt also advised that he has the option fo going to the ED via EMS or someone else can take him but he notes that he is not having pressure now but will take it into consideration of he feels it is his only option.

## 2020-08-27 NOTE — Telephone Encounter (Signed)
Spoke with Dr. Excell Seltzer and he recommends the pt be seen Monday... January 31,2022 with Norma Fredrickson NP at the [facility] office.   Pt agrees and verbalized understanding to go to the ED over the weekend if his symptoms persist or worsen.

## 2020-08-29 NOTE — Telephone Encounter (Signed)
Harry Molina - thanks for seeing him. He has bad family hx but had completely normal cardiac CTA a year ago. Depending on symptoms, some sort of noninvasive testing might be appropriate.   Kathlene November

## 2020-08-29 NOTE — Progress Notes (Signed)
CARDIOLOGY OFFICE NOTE  Date:  08/30/2020    Harry Molina Date of Birth: Dec 01, 1970 Medical Record #[id number]  PCP:  Chilton Greathouse, MD  Cardiologist:  Excell Seltzer  Chief Complaint  Patient presents with  . Chest Pain    Work in visit - seen for Dr. Excell Seltzer    History of Present Illness: Harry Molina is a 50 y.o. male who presents today for a work in visit. Seen for Dr. Excell Seltzer.   He has a strong family history of CAD. His father - also a patient of Dr. Earmon Phoenix, had his first MI in his 42's.   The patient underwent testing in 2017, including a coronary Ca CT, echo, and exercise treadmill. He had a calcium score of zero, prior abnormal treadmill study with with normal stress echo with the exception of mild aortic valve insufficiency in 2017. He had a repeat echo in 2018 showing stable, mild aortic insufficiency.   He was last seen one year ago - had some intermittent chest pain - coronary CTA was done - this was very reassuring.   Phone call last week with 4 week history of chest pain - he has had a cousin die recently at 50 from CAD - he was quite concerned - thus added to this schedule for today.   Comes in today. Here alone.  Notes at least a month and a half of chest discomfort. Describes this as chest pressure sensation.  He exercised one day - was concerned because his HR was 105 and seemed slow to come down. Using snuff. LDL was 70 last summer. Admits he is wanting to be cautious and is concerned due to his FH. His cousin dying recently at age 85 is what prompted him to call us. He has cut back on his exercise recently due to this chest pain. His chest pain is at rest. He also endorses lots of Gi issues - to see GI soon.   Past Medical History:  Diagnosis Date  . Celiac sprue    Resolved at age 61-13  . Hyperlipidemia   . Hypertension   . Lactose intolerance   . Mitral valve prolapse   . Pyloric stenosis   . Spontaneous pneumothorax    unsure side     Past Surgical History:  Procedure Laterality Date  . BIOPSY STOMACH    . OPEN REDUCTION INTERNAL FIXATION (ORIF) PROXIMAL PHALANX Right 09/04/2015   Procedure: OPEN REDUCTION INTERNAL FIXATION (ORIF) RIGHT SMALL FINGER PROXIMAL PHALANX;  Surgeon: Dominica Severin, MD;  Location: [facility] OR;  Service: Orthopedics;  Laterality: Right;  . pyloric stenosis       Medications: Current Meds  Medication Sig  . aspirin EC 81 MG tablet Take 81 mg by mouth daily.  Marland Kitchen loratadine (CLARITIN) 10 MG tablet Take 10 mg by mouth as needed for allergies.  Marland Kitchen MULTIPLE VITAMIN PO Take 1 tablet by mouth daily.  Marland Kitchen olmesartan (BENICAR) 40 MG tablet Take 20 mg by mouth daily.   . rosuvastatin (CRESTOR) 20 MG tablet Take 20 mg by mouth daily.     Allergies: No Known Allergies  Social History: The patient  reports that he has never smoked. His smokeless tobacco use includes snuff. He reports current alcohol use. He reports that he does not use drugs.   Family History: The patient's family history includes Esophageal cancer in his maternal grandfather; Heart attack in his father; Heart disease in his father; Hyperlipidemia in his father; Skin cancer in his mother.  Review of Systems: Please see the history of present illness.   All other systems are reviewed and negative.   Physical Exam: VS:  BP 130/80 (BP Location: Left Arm, Patient Position: Sitting, Cuff Size: Normal)   Pulse 74   Ht 6\' 1"  (1.854 m)   Wt 191 lb 1.9 oz (86.7 kg)   BMI 25.22 kg/m  .  BMI Body mass index is 25.22 kg/m.  Wt Readings from Last 3 Encounters:  08/30/20 191 lb 1.9 oz (86.7 kg)  06/22/20 191 lb 3.2 oz (86.7 kg)  06/23/19 197 lb (89.4 kg)    General: Pleasant. Alert and in no acute distress.   Neck: Supple, no JVD, carotid bruits, or masses noted.  Cardiac: Regular rate and rhythm. No murmurs, rubs, or gallops. No edema.  Respiratory:  Lungs are clear to auscultation bilaterally with normal work of breathing.  GI: Soft  and nontender.  MS: No deformity or atrophy. Gait and ROM intact.  Skin: Warm and dry. Color is normal.  Neuro:  Strength and sensation are intact and no gross focal deficits noted.  Psych: Alert, appropriate and with normal affect.   LABORATORY DATA:  EKG:  EKG is ordered today.  Personally reviewed by me. This demonstrates NSR - and is unchanged - HR is 74.  Lab Results  Component Value Date   WBC 5.8 12/07/2015   HGB 13.8 12/07/2015   HCT 41.5 12/07/2015   PLT 224 12/07/2015   GLUCOSE 105 (H) 12/07/2015   ALT 33 12/07/2015   AST 29 12/07/2015   NA 141 12/07/2015   K 4.0 12/07/2015   CL 104 12/07/2015   CREATININE 0.94 12/07/2015   BUN 13 12/07/2015   CO2 25 12/07/2015       BNP (last 3 results) No results for input(s): BNP in the last 8760 hours.  ProBNP (last 3 results) No results for input(s): PROBNP in the last 8760 hours.   Other Studies Reviewed Today:  CORONARY CTA IMPRESSION January 2021: 1. Coronary calcium score of 0. This was 0 percentile for age and sex matched control.  2. Normal coronary origin with right dominance.  3. No evidence of CAD.  CAD-RADS 0. No evidence of CAD (0%). Consider non-atherosclerotic causes of chest pain.   ASSESSMENT & PLAN:    1. Chest pain - he has had a basically normal CTA a year ago of the coronaries - will start PPI and arrange for stress echo. If ok - may resume his exercise program.       :February 2021 The risks [chest pain, shortness of breath, cardiac arrhythmias, dizziness, blood pressure fluctuations, myocardial infarction, stroke/transient ischemic attack, and life-threatening complications (estimated to be 1 in 10,000)], benefits (risk stratification, diagnosing coronary artery disease, treatment guidance) and alternatives of a dobutamine stress echocardiogram were discussed in detail with Mr. Rosengrant and he agrees to proceed.  2. +FH for early CAD- needs aggressive risk factor modification - would stop  the snuff.   3. HLD - on Crestor  4. HTN - BP is fine - on Benicar. No changes made.   5. Mild AI - will see what his echo shows.   6. History of GI issues - will add PPI - he notes he is to see GI soon - wanted to make sure his cardiac status is ok.  Current medicines are reviewed with the patient today.  The patient does not have concerns regarding medicines other than what has been noted above.  The following changes have  been made:  See above.  Labs/ tests ordered today include:    Orders Placed This Encounter  Procedures  . Cardiac Stress Test: Informed Consent Details: Physician/Practitioner Attestation; Transcribe to consent form and obtain patient signature  . EKG 12-Lead  . ECHOCARDIOGRAM STRESS TEST     Disposition:   FU with Dr. Excell Seltzer as planned unless studies are abnormal. Further disposition to follow.    Patient is agreeable to this plan and will call if any problems develop in the interim.   SignedNorma Fredrickson, NP  08/30/2020 9:52 AM  [facility] [address] [place], [place]  [address] Phone: [phone] Fax: [phone]

## 2020-08-30 ENCOUNTER — Ambulatory Visit: Payer: 59 | Admitting: Nurse Practitioner

## 2020-08-30 ENCOUNTER — Encounter: Payer: Self-pay | Admitting: Nurse Practitioner

## 2020-08-30 ENCOUNTER — Other Ambulatory Visit: Payer: Self-pay

## 2020-08-30 VITALS — BP 130/80 | HR 74 | Ht 73.0 in | Wt 191.1 lb

## 2020-08-30 DIAGNOSIS — R079 Chest pain, unspecified: Secondary | ICD-10-CM | POA: Diagnosis not present

## 2020-08-30 NOTE — Patient Instructions (Addendum)
After Visit Summary:  We will be checking the following labs today - NONE   Medication Instructions:    Continue with your current medicines.   Ok to use some OTC Prilosec/Nexium once a day - or Pepcid daily for the next 2 weeks.    If you need a refill on your cardiac medications before your next appointment, please call your pharmacy.     Testing/Procedures To Be Arranged:  Stress echocardiogram  Follow-Up:   Will see how your study turns out    At [facility], you and your health needs are our priority.  As part of our continuing mission to provide you with exceptional heart care, we have created designated Provider Care Teams.  These Care Teams include your primary Cardiologist (physician) and Advanced Practice Providers (APPs -  Physician Assistants and Nurse Practitioners) who all work together to provide you with the care you need, when you need it.  Special Instructions:  . Stay safe, wash your hands for at least 20 seconds and wear a mask when needed.  . It was good to talk with you today.    Call the [facility] office at [phone] if you have any questions, problems or concerns.

## 2020-08-30 NOTE — Telephone Encounter (Signed)
Perfect, thanks 

## 2020-08-30 NOTE — Telephone Encounter (Signed)
Advised to add PPI - getting stress echo.   Lawson Fiscal

## 2020-09-03 ENCOUNTER — Other Ambulatory Visit: Payer: Self-pay

## 2020-09-03 ENCOUNTER — Ambulatory Visit (AMBULATORY_SURGERY_CENTER): Payer: 59 | Admitting: *Deleted

## 2020-09-03 VITALS — Ht 73.0 in | Wt 190.0 lb

## 2020-09-03 DIAGNOSIS — R197 Diarrhea, unspecified: Secondary | ICD-10-CM

## 2020-09-03 DIAGNOSIS — Z8719 Personal history of other diseases of the digestive system: Secondary | ICD-10-CM

## 2020-09-03 DIAGNOSIS — R1084 Generalized abdominal pain: Secondary | ICD-10-CM

## 2020-09-03 NOTE — Progress Notes (Signed)
No egg or soy allergy known to patient - Lactose intolerance  No issues with past sedation with any surgeries or procedures No intubation problems in the past  No FH of Malignant Hyperthermia No diet pills per patient No home 02 use per patient  No blood thinners per patient  Pt denies issues with constipation  No A fib or A flutter  EMMI video to pt or via MyChart  COVID 19 guidelines implemented in PV today with Pt and RN  Pt is fully vaccinated  for Covid   Due to the COVID-19 pandemic we are asking patients to follow certain guidelines.  Pt aware of COVID protocols and [facility] guidelines   Pt verified name, DOB, address and insurance during PV today. Pt mailed instruction packet to included paper to complete and mail back to [facility] with addressed and stamped envelope, Emmi video, copy of consent form to read and not return, and instructions. PV completed over the phone. Pt encouraged to call with questions or issues  My Chart instructions as well

## 2020-09-09 ENCOUNTER — Telehealth (HOSPITAL_COMMUNITY): Payer: Self-pay | Admitting: *Deleted

## 2020-09-09 NOTE — Telephone Encounter (Signed)
Left message with instructions for upcoming stress test..  Harry Molina  

## 2020-09-11 ENCOUNTER — Other Ambulatory Visit (HOSPITAL_COMMUNITY)
Admission: RE | Admit: 2020-09-11 | Discharge: 2020-09-11 | Disposition: A | Discharge status: Home / Self Care | Payer: 59 | Source: Ambulatory Visit | Attending: Nurse Practitioner | Admitting: Nurse Practitioner

## 2020-09-11 DIAGNOSIS — Z20822 Contact with and (suspected) exposure to covid-19: Secondary | ICD-10-CM | POA: Insufficient documentation

## 2020-09-11 LAB — SARS CORONAVIRUS 2 (TAT 6-24 HRS): SARS Coronavirus 2: NEGATIVE

## 2020-09-14 ENCOUNTER — Other Ambulatory Visit (HOSPITAL_COMMUNITY): Payer: 59

## 2020-09-15 ENCOUNTER — Ambulatory Visit (HOSPITAL_COMMUNITY): Payer: 59

## 2020-09-15 ENCOUNTER — Ambulatory Visit (HOSPITAL_COMMUNITY): Payer: 59 | Attending: Cardiology

## 2020-09-15 ENCOUNTER — Other Ambulatory Visit: Payer: Self-pay

## 2020-09-15 DIAGNOSIS — R079 Chest pain, unspecified: Secondary | ICD-10-CM | POA: Diagnosis not present

## 2020-09-15 LAB — ECHOCARDIOGRAM STRESS TEST
Area-P 1/2: 2.71 cm2
P 1/2 time: 380 msec
S' Lateral: 3.1 cm

## 2020-09-16 ENCOUNTER — Other Ambulatory Visit: Payer: Self-pay

## 2020-09-16 ENCOUNTER — Encounter: Payer: Self-pay | Admitting: Physician Assistant

## 2020-09-16 ENCOUNTER — Ambulatory Visit: Payer: 59 | Admitting: Physician Assistant

## 2020-09-16 ENCOUNTER — Telehealth: Payer: Self-pay | Admitting: Cardiovascular Disease

## 2020-09-16 VITALS — BP 120/76 | HR 81 | Ht 73.0 in | Wt 190.0 lb

## 2020-09-16 DIAGNOSIS — R079 Chest pain, unspecified: Secondary | ICD-10-CM | POA: Diagnosis not present

## 2020-09-16 DIAGNOSIS — I10 Essential (primary) hypertension: Secondary | ICD-10-CM

## 2020-09-16 DIAGNOSIS — R9439 Abnormal result of other cardiovascular function study: Secondary | ICD-10-CM | POA: Diagnosis not present

## 2020-09-16 DIAGNOSIS — E782 Mixed hyperlipidemia: Secondary | ICD-10-CM | POA: Diagnosis not present

## 2020-09-16 MED ORDER — CARVEDILOL 3.125 MG PO TABS: 3.1250 mg | ORAL_TABLET | Freq: Two times a day (BID) | ORAL | 3 refills | Status: DC

## 2020-09-16 NOTE — Patient Instructions (Addendum)
Medication Instructions:  Your physician has recommended you make the following change in your medication:  1.  START Coreg (Carvedilol) 3.125 mg taking 1 tablet twice a day   *If you need a refill on your cardiac medications before your next appointment, please call your pharmacy*   Lab Work: TODAY:  BMET & CBC  If you have labs (blood work) drawn today and your tests are completely normal, you will receive your results only by: Marland Kitchen MyChart Message (if you have MyChart) OR . A paper copy in the mail If you have any lab test that is abnormal or we need to change your treatment, we will call you to review the results.   Testing/Procedures: Your physician has requested that you have a cardiac catheterization. Cardiac catheterization is used to diagnose and/or treat various heart conditions. Doctors may recommend this procedure for a number of different reasons. The most common reason is to evaluate chest pain. Chest pain can be a symptom of coronary artery disease (CAD), and cardiac catheterization can show whether plaque is narrowing or blocking your heart's arteries. This procedure is also used to evaluate the valves, as well as measure the blood flow and oxygen levels in different parts of your heart. For further information please visit [email or web address] Please follow instruction sheet, BELOW:     [facility] CARDIOVASCULAR DIVISION [facility] ST OFFICE [address] [place] [address] Dept: [phone] Loc: [phone]  Harry Molina  09/16/2020  You are scheduled for a Cardiac Catheterization on Tuesday, February 22 with Dr. Verdis Prime.  1. Please arrive at the [facility] (Main Entrance A) at [facility]: [address] [place], [place] [address] at 5:30 AM (This time is two hours before your procedure to ensure your preparation). Free valet parking service is available.   Special note: Every effort is made  to have your procedure done on time. Please understand that emergencies sometimes delay scheduled procedures.  2. Diet: Do not eat solid foods after midnight.  The patient may have clear liquids until 5am upon the day of the procedure.  3. Labs: You will need to have blood drawn on TODAY.  4. Medication instructions in preparation for your procedure:   Contrast Allergy: No  On the morning of your procedure, take your Aspirin and any morning medicines NOT listed above.  You may use sips of water.  5. Plan for one night stay--bring personal belongings. 6. Bring a current list of your medications and current insurance cards. 7. You MUST have a responsible person to drive you home. 8. Someone MUST be with you the first 24 hours after you arrive home or your discharge will be delayed. 9. Please wear clothes that are easy to get on and off and wear slip-on shoes.  Thank you for allowing [place] to care for you!   -- [facility]   COVID TESTING:    Due to recent COVID-19 restrictions implemented by our local and state authorities and in an effort to keep both patients and staff as safe as possible, our hospital system requires COVID-19 testing prior to certain scheduled hospital procedures.  Please go to [address] [facility] [place], [place] [address] on 09/18/2020 AT 11:25 am  .  This is a drive up testing site.  You will not need to exit your vehicle.  You will not be billed at the time of testing but may receive a bill later depending  on your insurance. You must agree to self-quarantine from the time of your testing until the procedure date on 09/22/20.  This should included staying home with ONLY the people you live with.  Avoid take-out, grocery store shopping or leaving the house for any non-emergent reason.  Failure to have your COVID-19 test done on the date and time you have been scheduled will result in cancellation of your procedure.  Please call our office at  [phone] if you have any questions.   Follow-Up: At [facility], you and your health needs are our priority.  As part of our continuing mission to provide you with exceptional heart care, we have created designated Provider Care Teams.  These Care Teams include your primary Cardiologist (physician) and Advanced Practice Providers (APPs -  Physician Assistants and Nurse Practitioners) who all work together to provide you with the care you need, when you need it.  We recommend signing up for the patient portal called "MyChart".  Sign up information is provided on this After Visit Summary.  MyChart is used to connect with patients for Virtual Visits (Telemedicine).  Patients are able to view lab/test results, encounter notes, upcoming appointments, etc.  Non-urgent messages can be sent to your provider as well.   To learn more about what you can do with MyChart, go to [email or web address].    Your next appointment:   10/05/2020   ARRIVE AT 2:00   The format for your next appointment:   In Person  Provider:   Chelsea Aus, PA-C   Other Instructions

## 2020-09-16 NOTE — Progress Notes (Signed)
Cardiology Office Note:    Date:  09/16/2020   ID:  Harry Molina, DOB 1971/07/21, MRN [id number]  PCP:  Chilton Greathouse, MD  [facility] Cardiologist:  Tonny Bollman, MD  [facility] Electrophysiologist:  None   Chief Complaint: abnormal stress echo   History of Present Illness:    Harry Molina is a 50 y.o. male with a hx of HTN, HLD and strong family hx of CAD seen for abnormal stress echo.   Strong family hx of CAD. Father with first MI in his 34s. cousin just died suddenly at age 82.    The patient underwent testing in 2017, including a coronary Ca CT, echo, and exercise treadmill. He had a calcium score of zero, prior abnormal treadmill study with with normal stress echo with the exception of mild aortic valve insufficiency in 2017. He had a repeat echo in 2018 showing stable, mild aortic insufficiency.   Coronary CTA 08/2019  1. Coronary calcium score of 0. This was 0 percentile for age and sex matched control.  2. Normal coronary origin with right dominance.  3. No evidence of CAD.   Seen by Norma Fredrickson, NP 08/30/20 for chest pain. Worreed after cousin died but symptom started before that. Started on PPI for possible GERD. Had abnormal stress echo and presents for further discussion.  Patient continues to have intermittent chest pain.  Denies syncope, dizziness, orthopnea, PND or melena.   1. This is a positive stress echocardiogram for ischemia (see scoring  diagram/findings for description).  2. This is an intermediate risk study.   Conclusion(s)/Recommendation(s): Abnormal echo stress test. There appears  to be hypokinesis with a hinge point in the mid to distal septal and  inferior walls. Overall EF improves with stress except for this small  portion.    FINDINGS   Exam Protocol: The patient exercised on a treadmill according to a Bruce  protocol.     Patient Performance: The patient exercised for 10 minutes and 50 seconds,   achieving 13 METS. The maximum stage achieved was IV of the Bruce  protocol. The baseline heart rate was 88 bpm. The heart rate at peak  stress was 179 bpm. The target heart rate was  calculated to be 145 bpm. The percentage of maximum predicted heart rate  achieved was 104.8 %. The baseline blood pressure was 149/90 mmHg. The  blood pressure at peak stress was 212/57 mmHg. The blood pressure response  was hypertensive. The patient  developed fatigue during the stress exam. The symptoms resolved with rest.  The patient's functional capacity was above average.    EKG: Resting EKG showed normal sinus rhythm with no abnormal findings. The  patient developed occasional PAC during exercise. There was 1.0 mm of  horizontal ST segment depression in lead(s) III.     2D Echo Findings: The baseline ejection fraction was 60%. The peak  ejection fraction at stress was 80%. Baseline regional wall motion  abnormalities were not present. This is a positive stress echocardiogram  for ischemia.     LV Wall Scoring:  Peak Stress The apical septal segment is akinetic. The entire inferior  wall,       mid anteroseptal segment, and mid inferoseptal segment are       hypokinetic. The entire anterior wall, entire lateral wall,  basal       anteroseptal segment, basal inferoseptal segment, and apex are       normal.      Jodelle Red MD  Electronically signed on 09/15/2020 at 4:29:07 PM   Past Medical History:  Diagnosis Date  . Allergy    mild   . Celiac sprue    Resolved at age 23-13  . Hyperlipidemia   . Hypertension   . Lactose intolerance   . Mitral valve prolapse   . Pyloric stenosis   . Spontaneous pneumothorax    unsure side    Past Surgical History:  Procedure Laterality Date  . BIOPSY STOMACH    . OPEN REDUCTION INTERNAL FIXATION (ORIF) PROXIMAL PHALANX Right 09/04/2015   Procedure: OPEN REDUCTION INTERNAL FIXATION (ORIF) RIGHT SMALL  FINGER PROXIMAL PHALANX;  Surgeon: Dominica Severin, MD;  Location: [facility] OR;  Service: Orthopedics;  Laterality: Right;  . pyloric stenosis    . UPPER GASTROINTESTINAL ENDOSCOPY     as a child - small bowel bx for celiac     Current Medications: Current Meds  Medication Sig  . aspirin EC 81 MG tablet Take 81 mg by mouth daily.  . carvedilol (COREG) 3.125 MG tablet Take 1 tablet (3.125 mg total) by mouth 2 (two) times daily.  . MULTIPLE VITAMIN PO Take 1 tablet by mouth daily.  Marland Kitchen olmesartan (BENICAR) 40 MG tablet Take 20 mg by mouth daily.   . rosuvastatin (CRESTOR) 20 MG tablet Take 20 mg by mouth daily.     Allergies:   Patient has no known allergies.   Social History   Socioeconomic History  . Marital status: Married    Spouse name: Not on file  . Number of children: 3  . Years of education: College  . Highest education level: Not on file  Occupational History  . Occupation: [employer]   Tobacco Use  . Smoking status: Never Smoker  . Smokeless tobacco: Current User    Types: Snuff  . Tobacco comment: states he doesn't use smokeless tobacco "regularly"  Vaping Use  . Vaping Use: Never used  Substance and Sexual Activity  . Alcohol use: Yes    Alcohol/week: 0.0 standard drinks    Comment: social  . Drug use: No  . Sexual activity: Not Currently  Other Topics Concern  . Not on file  Social History Narrative   Drinks about 3-4 diet cokes a day    Social Determinants of Health   Financial Resource Strain: Not on file  Food Insecurity: Not on file  Transportation Needs: Not on file  Physical Activity: Not on file  Stress: Not on file  Social Connections: Not on file     Family History: The patient's family history includes Esophageal cancer in his maternal grandfather; Heart attack in his father; Heart disease in his father; Hyperlipidemia in his father; Skin cancer in his mother. There is no history of Colon cancer, Stomach cancer, Pancreatic cancer, Liver  disease, Colon polyps, or Rectal cancer.    ROS:   Please see the history of present illness.    All other systems reviewed and are negative. EKGs/Labs/Other Studies Reviewed:    The following studies were reviewed today: As above   EKG:  EKG is not  ordered today.   Recent Labs: No results found for requested labs within last 8760 hours.  Recent Lipid Panel No results found for: CHOL, TRIG, HDL, CHOLHDL, VLDL, LDLCALC, LDLDIRECT   Physical Exam:    VS:  BP 120/76   Pulse 81   Ht 6\' 1"  (1.854 m)   Wt 190 lb (86.2 kg)   SpO2 99%   BMI 25.07 kg/m  Wt Readings from Last 3 Encounters:  09/16/20 190 lb (86.2 kg)  09/03/20 190 lb (86.2 kg)  08/30/20 191 lb 1.9 oz (86.7 kg)     GEN:  Well nourished, well developed in no acute distress HEENT: Normal NECK: No JVD; No carotid bruits LYMPHATICS: No lymphadenopathy CARDIAC: RRR, no murmurs, rubs, gallops RESPIRATORY:  Clear to auscultation without rales, wheezing or rhonchi  ABDOMEN: Soft, non-tender, non-distended MUSCULOSKELETAL:  No edema; No deformity  SKIN: Warm and dry NEUROLOGIC:  Alert and oriented x 3 PSYCHIATRIC:  Normal affect   ASSESSMENT AND PLAN:    1. Abnormal stress echocardiogram 2. Chest pain 3. Family history of CAD  EF of  60% at baseline.  Abnormal stress study with wall motion abnormality as summarized above.  Patient is under a lot of stress as his cousin died suddenly at age 50 few weeks ago.  Continue aspirin, statin and Benicar.  Add low-dose Coreg. The patient understands that risks include but are not limited to stroke (1 in 1000), death (1 in 1000), kidney failure [usually temporary] (1 in 500), bleeding (1 in 200), allergic reaction [possibly serious] (1 in 200), and agrees to proceed.   4. HTN - BP stable   5. HLD - No results found for requested labs within last 8760 hours.  - Continue Crestor 20mg  qd   Medication Adjustments/Labs and Tests Ordered: Current medicines are reviewed  at length with the patient today.  Concerns regarding medicines are outlined above.  Orders Placed This Encounter  Procedures  . Basic metabolic panel  . CBC   Meds ordered this encounter  Medications  . carvedilol (COREG) 3.125 MG tablet    Sig: Take 1 tablet (3.125 mg total) by mouth 2 (two) times daily.    Dispense:  180 tablet    Refill:  3    Patient Instructions  Medication Instructions:  Your physician has recommended you make the following change in your medication:  1.  START Coreg (Carvedilol) 3.125 mg taking 1 tablet twice a day   *If you need a refill on your cardiac medications before your next appointment, please call your pharmacy*   Lab Work: TODAY:  BMET & CBC  If you have labs (blood work) drawn today and your tests are completely normal, you will receive your results only by: Marland Kitchen. MyChart Message (if you have MyChart) OR . A paper copy in the mail If you have any lab test that is abnormal or we need to change your treatment, we will call you to review the results.   Testing/Procedures: Your physician has requested that you have a cardiac catheterization. Cardiac catheterization is used to diagnose and/or treat various heart conditions. Doctors may recommend this procedure for a number of different reasons. The most common reason is to evaluate chest pain. Chest pain can be a symptom of coronary artery disease (CAD), and cardiac catheterization can show whether plaque is narrowing or blocking your heart's arteries. This procedure is also used to evaluate the valves, as well as measure the blood flow and oxygen levels in different parts of your heart. For further information please visit [email or web address]. Please follow instruction sheet, BELOW:     [facility] [address] [facility] [address], [address] [place] [id number] Dept: [phone] Loc: [phone]  Harry LusterMatthew F  O'Connell  09/16/2020  You are scheduled for a Cardiac Catheterization on Tuesday, February 22 with Dr. Verdis PrimeHenry Molina.  1. Please arrive at  the [employer] (Main Entrance A) at [facility]: [address] [place], [place] [address] at 5:30 AM (This time is two hours before your procedure to ensure your preparation). Free valet parking service is available.   Special note: Every effort is made to have your procedure done on time. Please understand that emergencies sometimes delay scheduled procedures.  2. Diet: Do not eat solid foods after midnight.  The patient may have clear liquids until 5am upon the day of the procedure.  3. Labs: You will need to have blood drawn on TODAY.  4. Medication instructions in preparation for your procedure:   Contrast Allergy: No  On the morning of your procedure, take your Aspirin and any morning medicines NOT listed above.  You may use sips of water.  5. Plan for one night stay--bring personal belongings. 6. Bring a current list of your medications and current insurance cards. 7. You MUST have a responsible person to drive you home. 8. Someone MUST be with you the first 24 hours after you arrive home or your discharge will be delayed. 9. Please wear clothes that are easy to get on and off and wear slip-on shoes.  Thank you for allowing [place] to care for you!   -- Harry Molina services   COVID TESTING:    Due to recent COVID-19 restrictions implemented by our local and state authorities and in an effort to keep both patients and staff as safe as possible, our hospital system requires COVID-19 testing prior to certain scheduled hospital procedures.  Please go to [address] [facility] [place], [place] [address] on 09/18/2020 AT 11:25 am  .  This is a drive up testing site.  You will not need to exit your vehicle.  You will not be billed at the time of testing but may receive a bill later depending on your insurance. You must agree to  self-quarantine from the time of your testing until the procedure date on 09/22/20.  This should included staying home with ONLY the people you live with.  Avoid take-out, grocery store shopping or leaving the house for any non-emergent reason.  Failure to have your COVID-19 test done on the date and time you have been scheduled will result in cancellation of your procedure.  Please call our office at [phone] if you have any questions.   Follow-Up: At [facility], you and your health needs are our priority.  As part of our continuing mission to provide you with exceptional heart care, we have created designated Provider Care Teams.  These Care Teams include your primary Cardiologist (physician) and Advanced Practice Providers (APPs -  Physician Assistants and Nurse Practitioners) who all work together to provide you with the care you need, when you need it.  We recommend signing up for the patient portal called "MyChart".  Sign up information is provided on this After Visit Summary.  MyChart is used to connect with patients for Virtual Visits (Telemedicine).  Patients are able to view lab/test results, encounter notes, upcoming appointments, etc.  Non-urgent messages can be sent to your provider as well.   To learn more about what you can do with MyChart, go to [email or web address].    Your next appointment:   10/05/2020   ARRIVE AT 2:00   The format for your next appointment:   In Person  Provider:   Chelsea Aus, PA-C   Other Instructions      Signed, Harry Molina, Harry Molina  09/16/2020 11:55 AM  [facility]

## 2020-09-16 NOTE — H&P (View-Only) (Signed)
Cardiology Office Note:    Date:  09/16/2020   ID:  Harry Molina, DOB 1971/07/21, MRN [id number]  PCP:  Chilton Greathouse, MD  [facility] Cardiologist:  Tonny Bollman, MD  [facility] Electrophysiologist:  None   Chief Complaint: abnormal stress echo   History of Present Illness:    Harry Molina is a 50 y.o. male with a hx of HTN, HLD and strong family hx of CAD seen for abnormal stress echo.   Strong family hx of CAD. Father with first MI in his 34s. cousin just died suddenly at age 82.    The patient underwent testing in 2017, including a coronary Ca CT, echo, and exercise treadmill. He had a calcium score of zero, prior abnormal treadmill study with with normal stress echo with the exception of mild aortic valve insufficiency in 2017. He had a repeat echo in 2018 showing stable, mild aortic insufficiency.   Coronary CTA 08/2019  1. Coronary calcium score of 0. This was 0 percentile for age and sex matched control.  2. Normal coronary origin with right dominance.  3. No evidence of CAD.   Seen by Norma Fredrickson, NP 08/30/20 for chest pain. Worreed after cousin died but symptom started before that. Started on PPI for possible GERD. Had abnormal stress echo and presents for further discussion.  Patient continues to have intermittent chest pain.  Denies syncope, dizziness, orthopnea, PND or melena.   1. This is a positive stress echocardiogram for ischemia (see scoring  diagram/findings for description).  2. This is an intermediate risk study.   Conclusion(s)/Recommendation(s): Abnormal echo stress test. There appears  to be hypokinesis with a hinge point in the mid to distal septal and  inferior walls. Overall EF improves with stress except for this small  portion.    FINDINGS   Exam Protocol: The patient exercised on a treadmill according to a Bruce  protocol.     Patient Performance: The patient exercised for 10 minutes and 50 seconds,   achieving 13 METS. The maximum stage achieved was IV of the Bruce  protocol. The baseline heart rate was 88 bpm. The heart rate at peak  stress was 179 bpm. The target heart rate was  calculated to be 145 bpm. The percentage of maximum predicted heart rate  achieved was 104.8 %. The baseline blood pressure was 149/90 mmHg. The  blood pressure at peak stress was 212/57 mmHg. The blood pressure response  was hypertensive. The patient  developed fatigue during the stress exam. The symptoms resolved with rest.  The patient's functional capacity was above average.    EKG: Resting EKG showed normal sinus rhythm with no abnormal findings. The  patient developed occasional PAC during exercise. There was 1.0 mm of  horizontal ST segment depression in lead(s) III.     2D Echo Findings: The baseline ejection fraction was 60%. The peak  ejection fraction at stress was 80%. Baseline regional wall motion  abnormalities were not present. This is a positive stress echocardiogram  for ischemia.     LV Wall Scoring:  Peak Stress The apical septal segment is akinetic. The entire inferior  wall,       mid anteroseptal segment, and mid inferoseptal segment are       hypokinetic. The entire anterior wall, entire lateral wall,  basal       anteroseptal segment, basal inferoseptal segment, and apex are       normal.      Jodelle Red MD  Electronically signed on 09/15/2020 at 4:29:07 PM   Past Medical History:  Diagnosis Date  . Allergy    mild   . Celiac sprue    Resolved at age 23-13  . Hyperlipidemia   . Hypertension   . Lactose intolerance   . Mitral valve prolapse   . Pyloric stenosis   . Spontaneous pneumothorax    unsure side    Past Surgical History:  Procedure Laterality Date  . BIOPSY STOMACH    . OPEN REDUCTION INTERNAL FIXATION (ORIF) PROXIMAL PHALANX Right 09/04/2015   Procedure: OPEN REDUCTION INTERNAL FIXATION (ORIF) RIGHT SMALL  FINGER PROXIMAL PHALANX;  Surgeon: Dominica Severin, MD;  Location: [facility] OR;  Service: Orthopedics;  Laterality: Right;  . pyloric stenosis    . UPPER GASTROINTESTINAL ENDOSCOPY     as a child - small bowel bx for celiac     Current Medications: Current Meds  Medication Sig  . aspirin EC 81 MG tablet Take 81 mg by mouth daily.  . carvedilol (COREG) 3.125 MG tablet Take 1 tablet (3.125 mg total) by mouth 2 (two) times daily.  . MULTIPLE VITAMIN PO Take 1 tablet by mouth daily.  Marland Kitchen olmesartan (BENICAR) 40 MG tablet Take 20 mg by mouth daily.   . rosuvastatin (CRESTOR) 20 MG tablet Take 20 mg by mouth daily.     Allergies:   Patient has no known allergies.   Social History   Socioeconomic History  . Marital status: Married    Spouse name: Not on file  . Number of children: 3  . Years of education: College  . Highest education level: Not on file  Occupational History  . Occupation: [employer]   Tobacco Use  . Smoking status: Never Smoker  . Smokeless tobacco: Current User    Types: Snuff  . Tobacco comment: states he doesn't use smokeless tobacco "regularly"  Vaping Use  . Vaping Use: Never used  Substance and Sexual Activity  . Alcohol use: Yes    Alcohol/week: 0.0 standard drinks    Comment: social  . Drug use: No  . Sexual activity: Not Currently  Other Topics Concern  . Not on file  Social History Narrative   Drinks about 3-4 diet cokes a day    Social Determinants of Health   Financial Resource Strain: Not on file  Food Insecurity: Not on file  Transportation Needs: Not on file  Physical Activity: Not on file  Stress: Not on file  Social Connections: Not on file     Family History: The patient's family history includes Esophageal cancer in his maternal grandfather; Heart attack in his father; Heart disease in his father; Hyperlipidemia in his father; Skin cancer in his mother. There is no history of Colon cancer, Stomach cancer, Pancreatic cancer, Liver  disease, Colon polyps, or Rectal cancer.    ROS:   Please see the history of present illness.    All other systems reviewed and are negative. EKGs/Labs/Other Studies Reviewed:    The following studies were reviewed today: As above   EKG:  EKG is not  ordered today.   Recent Labs: No results found for requested labs within last 8760 hours.  Recent Lipid Panel No results found for: CHOL, TRIG, HDL, CHOLHDL, VLDL, LDLCALC, LDLDIRECT   Physical Exam:    VS:  BP 120/76   Pulse 81   Ht 6\' 1"  (1.854 m)   Wt 190 lb (86.2 kg)   SpO2 99%   BMI 25.07 kg/m  Wt Readings from Last 3 Encounters:  09/16/20 190 lb (86.2 kg)  09/03/20 190 lb (86.2 kg)  08/30/20 191 lb 1.9 oz (86.7 kg)     GEN:  Well nourished, well developed in no acute distress HEENT: Normal NECK: No JVD; No carotid bruits LYMPHATICS: No lymphadenopathy CARDIAC: RRR, no murmurs, rubs, gallops RESPIRATORY:  Clear to auscultation without rales, wheezing or rhonchi  ABDOMEN: Soft, non-tender, non-distended MUSCULOSKELETAL:  No edema; No deformity  SKIN: Warm and dry NEUROLOGIC:  Alert and oriented x 3 PSYCHIATRIC:  Normal affect   ASSESSMENT AND PLAN:    1. Abnormal stress echocardiogram 2. Chest pain 3. Family history of CAD  EF of  60% at baseline.  Abnormal stress study with wall motion abnormality as summarized above.  Patient is under a lot of stress as his cousin died suddenly at age 52 few weeks ago.  Continue aspirin, statin and Benicar.  Add low-dose Coreg. The patient understands that risks include but are not limited to stroke (1 in 1000), death (1 in 1000), kidney failure [usually temporary] (1 in 500), bleeding (1 in 200), allergic reaction [possibly serious] (1 in 200), and agrees to proceed.   4. HTN - BP stable   5. HLD - No results found for requested labs within last 8760 hours.  - Continue Crestor 20mg qd   Medication Adjustments/Labs and Tests Ordered: Current medicines are reviewed  at length with the patient today.  Concerns regarding medicines are outlined above.  Orders Placed This Encounter  Procedures  . Basic metabolic panel  . CBC   Meds ordered this encounter  Medications  . carvedilol (COREG) 3.125 MG tablet    Sig: Take 1 tablet (3.125 mg total) by mouth 2 (two) times daily.    Dispense:  180 tablet    Refill:  3    Patient Instructions  Medication Instructions:  Your physician has recommended you make the following change in your medication:  1.  START Coreg (Carvedilol) 3.125 mg taking 1 tablet twice a day   *If you need a refill on your cardiac medications before your next appointment, please call your pharmacy*   Lab Work: TODAY:  BMET & CBC  If you have labs (blood work) drawn today and your tests are completely normal, you will receive your results only by: . MyChart Message (if you have MyChart) OR . A paper copy in the mail If you have any lab test that is abnormal or we need to change your treatment, we will call you to review the results.   Testing/Procedures: Your physician has requested that you have a cardiac catheterization. Cardiac catheterization is used to diagnose and/or treat various heart conditions. Doctors may recommend this procedure for a number of different reasons. The most common reason is to evaluate chest pain. Chest pain can be a symptom of coronary artery disease (CAD), and cardiac catheterization can show whether plaque is narrowing or blocking your heart's arteries. This procedure is also used to evaluate the valves, as well as measure the blood flow and oxygen levels in different parts of your heart. For further information please visit www.[email or web address].org. Please follow instruction sheet, BELOW:     [facility] [address], [address] [place] [address] Dept: [phone] Loc: [phone]  Harry F  Molina  09/16/2020  You are scheduled for a Cardiac Catheterization on Tuesday, February 22 with Dr. Henry Smith.  1. Please arrive at   the [employer] (Main Entrance A) at [facility]: [address] [place], [place] [address] at 5:30 AM (This time is two hours before your procedure to ensure your preparation). Free valet parking service is available.   Special note: Every effort is made to have your procedure done on time. Please understand that emergencies sometimes delay scheduled procedures.  2. Diet: Do not eat solid foods after midnight.  The patient may have clear liquids until 5am upon the day of the procedure.  3. Labs: You will need to have blood drawn on TODAY.  4. Medication instructions in preparation for your procedure:   Contrast Allergy: No  On the morning of your procedure, take your Aspirin and any morning medicines NOT listed above.  You may use sips of water.  5. Plan for one night stay--bring personal belongings. 6. Bring a current list of your medications and current insurance cards. 7. You MUST have a responsible person to drive you home. 8. Someone MUST be with you the first 24 hours after you arrive home or your discharge will be delayed. 9. Please wear clothes that are easy to get on and off and wear slip-on shoes.  Thank you for allowing [place] to care for you!   -- [facility]   COVID TESTING:    Due to recent COVID-19 restrictions implemented by our local and state authorities and in an effort to keep both patients and staff as safe as possible, our hospital system requires COVID-19 testing prior to certain scheduled hospital procedures.  Please go to [address] [facility] [place], [place] [address] on 09/18/2020 AT 11:25 am  .  This is a drive up testing site.  You will not need to exit your vehicle.  You will not be billed at the time of testing but may receive a bill later depending on your insurance. You must agree to  self-quarantine from the time of your testing until the procedure date on 09/22/20.  This should included staying home with ONLY the people you live with.  Avoid take-out, grocery store shopping or leaving the house for any non-emergent reason.  Failure to have your COVID-19 test done on the date and time you have been scheduled will result in cancellation of your procedure.  Please call our office at [phone] if you have any questions.   Follow-Up: At [facility], you and your health needs are our priority.  As part of our continuing mission to provide you with exceptional heart care, we have created designated Provider Care Teams.  These Care Teams include your primary Cardiologist (physician) and Advanced Practice Providers (APPs -  Physician Assistants and Nurse Practitioners) who all work together to provide you with the care you need, when you need it.  We recommend signing up for the patient portal called "MyChart".  Sign up information is provided on this After Visit Summary.  MyChart is used to connect with patients for Virtual Visits (Telemedicine).  Patients are able to view lab/test results, encounter notes, upcoming appointments, etc.  Non-urgent messages can be sent to your provider as well.   To learn more about what you can do with MyChart, go to [email or web address].    Your next appointment:   10/05/2020   ARRIVE AT 2:00   The format for your next appointment:   In Person  Provider:   Chelsea Aus, PA-C   Other Instructions      Signed, Manson Passey, [place]  09/16/2020 11:55 AM  [facility]

## 2020-09-16 NOTE — Telephone Encounter (Signed)
Harry Molina is calling requesting to speak with Orpha Bur in regards to his upcoming procedure. Please advise.

## 2020-09-17 ENCOUNTER — Ambulatory Visit: Payer: 59 | Admitting: Physician Assistant

## 2020-09-17 NOTE — Telephone Encounter (Signed)
The patient is scheduled for LHC with Dr. Katrinka Blazing 2/22. He wanted to confirm instructions, discuss echo results in more detail, and see if he should wait until Dr. Excell Seltzer has an opening for cath.  Due to high anxiety, encouraged the patient to keep cath as scheduled with Dr. Katrinka Blazing as Dr. Excell Seltzer trusts him and that date is much sooner than Dr. Excell Seltzer can do cath.  Spoke with the patient at length about echo results and indication for cath.  He will take it easy over the weekend and seek emergent medical attention if symptoms worsen or if he has CP or SOB that does not resolve with rest. He was grateful for call and time spent.

## 2020-09-18 ENCOUNTER — Other Ambulatory Visit (HOSPITAL_COMMUNITY)
Admission: RE | Admit: 2020-09-18 | Discharge: 2020-09-18 | Disposition: A | Discharge status: Home / Self Care | Payer: 59 | Source: Ambulatory Visit | Attending: Interventional Cardiology | Admitting: Interventional Cardiology

## 2020-09-18 DIAGNOSIS — Z01812 Encounter for preprocedural laboratory examination: Secondary | ICD-10-CM | POA: Diagnosis not present

## 2020-09-18 DIAGNOSIS — Z20822 Contact with and (suspected) exposure to covid-19: Secondary | ICD-10-CM | POA: Insufficient documentation

## 2020-09-18 LAB — SARS CORONAVIRUS 2 (TAT 6-24 HRS): SARS Coronavirus 2: NEGATIVE

## 2020-09-19 LAB — CBC
Hematocrit: 42.3 % (ref 37.5–51.0)
Hemoglobin: 13.8 g/dL (ref 13.0–17.7)
MCH: 30.6 pg (ref 26.6–33.0)
MCHC: 32.6 g/dL (ref 31.5–35.7)
MCV: 94 fL (ref 79–97)
Platelets: 232 10*3/uL (ref 150–450)
RBC: 4.51 x10E6/uL (ref 4.14–5.80)
RDW: 12 % (ref 11.6–15.4)
WBC: 5.5 10*3/uL (ref 3.4–10.8)

## 2020-09-19 LAB — BASIC METABOLIC PANEL
BUN/Creatinine Ratio: 15 (ref 9–20)
BUN: 16 mg/dL (ref 6–24)
CO2: 20 mmol/L (ref 20–29)
Calcium: 9.7 mg/dL (ref 8.7–10.2)
Chloride: 102 mmol/L (ref 96–106)
Creatinine, Ser: 1.05 mg/dL (ref 0.76–1.27)
GFR calc Af Amer: 96 mL/min/{1.73_m2} (ref >59)
GFR calc non Af Amer: 83 mL/min/{1.73_m2} (ref >59)
Glucose: 99 mg/dL (ref 65–99)
Potassium: 4.4 mmol/L (ref 3.5–5.2)
Sodium: 139 mmol/L (ref 134–144)

## 2020-09-20 ENCOUNTER — Telehealth: Payer: Self-pay | Admitting: *Deleted

## 2020-09-20 NOTE — H&P (Signed)
Small WMA after 13 minutes on stress Echo. 2021, Cor CTA widely patent Cors

## 2020-09-20 NOTE — Progress Notes (Signed)
Pt has been made aware of normal result and verbalized understanding.  jw

## 2020-09-20 NOTE — Telephone Encounter (Signed)
Pt contacted pre-catheterization scheduled at [facility] for: Tuesday September 21, 2020 7:30 AM Verified arrival time and place: [facility] Main Entrance A [facility]) at: 5:30 AM  No solid food after midnight prior to cath, clear liquids until 5 AM day of procedure.   AM meds can be  taken pre-cath with sips of water including: ASA 81 mg   Confirmed patient has responsible adult to drive home post procedure and be with patient first 24 hours after arriving home: yes  You are allowed ONE visitor in the waiting room during the time you are at the hospital for your procedure. Both you and your visitor must wear a mask once you enter the hospital.   Reviewed procedure/mask/visitor instructions with patient.

## 2020-09-20 NOTE — Telephone Encounter (Signed)
Erroneous encounter

## 2020-09-21 ENCOUNTER — Ambulatory Visit (HOSPITAL_COMMUNITY)
Admission: RE | Admit: 2020-09-21 | Discharge: 2020-09-21 | Disposition: A | Discharge status: Home / Self Care | Payer: 59 | Attending: Interventional Cardiology | Admitting: Interventional Cardiology

## 2020-09-21 ENCOUNTER — Encounter (HOSPITAL_COMMUNITY): Payer: Self-pay | Admitting: Interventional Cardiology

## 2020-09-21 ENCOUNTER — Encounter (HOSPITAL_COMMUNITY)
Admission: RE | Disposition: A | Discharge status: Home / Self Care | Payer: Self-pay | Source: Home / Self Care | Attending: Interventional Cardiology

## 2020-09-21 DIAGNOSIS — Z7982 Long term (current) use of aspirin: Secondary | ICD-10-CM | POA: Diagnosis not present

## 2020-09-21 DIAGNOSIS — Z79899 Other long term (current) drug therapy: Secondary | ICD-10-CM | POA: Diagnosis not present

## 2020-09-21 DIAGNOSIS — F17290 Nicotine dependence, other tobacco product, uncomplicated: Secondary | ICD-10-CM | POA: Diagnosis not present

## 2020-09-21 DIAGNOSIS — Z8249 Family history of ischemic heart disease and other diseases of the circulatory system: Secondary | ICD-10-CM

## 2020-09-21 DIAGNOSIS — E785 Hyperlipidemia, unspecified: Secondary | ICD-10-CM | POA: Insufficient documentation

## 2020-09-21 DIAGNOSIS — I10 Essential (primary) hypertension: Secondary | ICD-10-CM | POA: Diagnosis not present

## 2020-09-21 DIAGNOSIS — R9439 Abnormal result of other cardiovascular function study: Secondary | ICD-10-CM

## 2020-09-21 DIAGNOSIS — R079 Chest pain, unspecified: Secondary | ICD-10-CM

## 2020-09-21 HISTORY — PX: LEFT HEART CATH AND CORONARY ANGIOGRAPHY: CATH118249

## 2020-09-21 SURGERY — LEFT HEART CATH AND CORONARY ANGIOGRAPHY
Anesthesia: LOCAL

## 2020-09-21 MED ORDER — SODIUM CHLORIDE 0.9 % WEIGHT BASED INFUSION: 1.0000 mL/kg/h | INTRAVENOUS | Status: DC

## 2020-09-21 MED ORDER — LIDOCAINE HCL (PF) 1 % IJ SOLN
INTRAMUSCULAR | Status: DC | PRN
  Administered 2020-09-21: 2 mL

## 2020-09-21 MED ORDER — SODIUM CHLORIDE 0.9 % IV SOLN: 250.0000 mL | INTRAVENOUS | Status: DC | PRN

## 2020-09-21 MED ORDER — FENTANYL CITRATE (PF) 100 MCG/2ML IJ SOLN
INTRAMUSCULAR | Status: AC
  Filled 2020-09-21: qty 2

## 2020-09-21 MED ORDER — VERAPAMIL HCL 2.5 MG/ML IV SOLN
INTRAVENOUS | Status: AC
  Filled 2020-09-21: qty 2

## 2020-09-21 MED ORDER — SODIUM CHLORIDE 0.9% FLUSH: 3.0000 mL | Freq: Two times a day (BID) | INTRAVENOUS | Status: DC

## 2020-09-21 MED ORDER — HEPARIN SODIUM (PORCINE) 1000 UNIT/ML IJ SOLN
INTRAMUSCULAR | Status: DC | PRN
  Administered 2020-09-21: 4500 [IU] via INTRAVENOUS

## 2020-09-21 MED ORDER — MIDAZOLAM HCL 2 MG/2ML IJ SOLN
INTRAMUSCULAR | Status: AC
  Filled 2020-09-21: qty 2

## 2020-09-21 MED ORDER — OXYCODONE HCL 5 MG PO TABS: 5.0000 mg | ORAL_TABLET | ORAL | Status: DC | PRN

## 2020-09-21 MED ORDER — FENTANYL CITRATE (PF) 100 MCG/2ML IJ SOLN
INTRAMUSCULAR | Status: DC | PRN
  Administered 2020-09-21: 25 ug via INTRAVENOUS
  Administered 2020-09-21: 50 ug via INTRAVENOUS

## 2020-09-21 MED ORDER — ACETAMINOPHEN 325 MG PO TABS: 650.0000 mg | ORAL_TABLET | ORAL | Status: DC | PRN

## 2020-09-21 MED ORDER — SODIUM CHLORIDE 0.9% FLUSH: 3.0000 mL | INTRAVENOUS | Status: DC | PRN

## 2020-09-21 MED ORDER — HYDRALAZINE HCL 20 MG/ML IJ SOLN: 10.0000 mg | INTRAMUSCULAR | Status: DC | PRN

## 2020-09-21 MED ORDER — HEPARIN SODIUM (PORCINE) 1000 UNIT/ML IJ SOLN
INTRAMUSCULAR | Status: AC
  Filled 2020-09-21: qty 1

## 2020-09-21 MED ORDER — ASPIRIN 81 MG PO CHEW: 81.0000 mg | CHEWABLE_TABLET | ORAL | Status: DC

## 2020-09-21 MED ORDER — LIDOCAINE HCL (PF) 1 % IJ SOLN
INTRAMUSCULAR | Status: AC
  Filled 2020-09-21: qty 30

## 2020-09-21 MED ORDER — LABETALOL HCL 5 MG/ML IV SOLN: 10.0000 mg | INTRAVENOUS | Status: DC | PRN

## 2020-09-21 MED ORDER — SODIUM CHLORIDE 0.9 % IV SOLN: INTRAVENOUS | Status: DC

## 2020-09-21 MED ORDER — HEPARIN (PORCINE) IN NACL 1000-0.9 UT/500ML-% IV SOLN
INTRAVENOUS | Status: DC | PRN
  Administered 2020-09-21 (×2): 500 mL

## 2020-09-21 MED ORDER — SODIUM CHLORIDE 0.9 % WEIGHT BASED INFUSION
3.0000 mL/kg/h | INTRAVENOUS | Status: AC
  Administered 2020-09-21: 3 mL/kg/h via INTRAVENOUS

## 2020-09-21 MED ORDER — MIDAZOLAM HCL 2 MG/2ML IJ SOLN
INTRAMUSCULAR | Status: DC | PRN
  Administered 2020-09-21: 1 mg via INTRAVENOUS
  Administered 2020-09-21: 0.5 mg via INTRAVENOUS

## 2020-09-21 MED ORDER — ONDANSETRON HCL 4 MG/2ML IJ SOLN: 4.0000 mg | Freq: Four times a day (QID) | INTRAMUSCULAR | Status: DC | PRN

## 2020-09-21 MED ORDER — VERAPAMIL HCL 2.5 MG/ML IV SOLN
INTRAVENOUS | Status: DC | PRN
  Administered 2020-09-21: 10 mL via INTRA_ARTERIAL

## 2020-09-21 MED ORDER — HEPARIN (PORCINE) IN NACL 1000-0.9 UT/500ML-% IV SOLN
INTRAVENOUS | Status: AC
  Filled 2020-09-21: qty 1000

## 2020-09-21 SURGICAL SUPPLY — 9 items

## 2020-09-21 NOTE — CV Procedure (Signed)
   Normal coronary arteries.  Right dominant.  Normal LV function with normal EDP.

## 2020-09-21 NOTE — Interval H&P Note (Signed)
Cath Lab Visit (complete for each Cath Lab visit)  Clinical Evaluation Leading to the Procedure:   ACS: No.  Non-ACS:    Anginal Classification: CCS II  Anti-ischemic medical therapy: No Therapy  Non-Invasive Test Results: Low-risk stress test findings: cardiac mortality <1%/year  Prior CABG: No previous CABG      History and Physical Interval Note:  09/21/2020 7:21 AM  Harry Molina  has presented today for surgery, with the diagnosis of positive stress test.  The various methods of treatment have been discussed with the patient and family. After consideration of risks, benefits and other options for treatment, the patient has consented to  Procedure(s): LEFT HEART CATH AND CORONARY ANGIOGRAPHY (N/A) as a surgical intervention.  The patient's history has been reviewed, patient examined, no change in status, stable for surgery.  I have reviewed the patient's chart and labs.  Questions were answered to the patient's satisfaction.     Lyn Records III

## 2020-09-21 NOTE — Discharge Instructions (Signed)
Radial Site Care  This sheet gives you information about how to care for yourself after your procedure. Your health care provider may also give you more specific instructions. If you have problems or questions, contact your health care provider. What can I expect after the procedure? After the procedure, it is common to have:  Bruising and tenderness at the catheter insertion area. Follow these instructions at home: Medicines  Take over-the-counter and prescription medicines only as told by your health care provider. Insertion site care 1. Follow instructions from your health care provider about how to take care of your insertion site. Make sure you: ? Wash your hands with soap and water before you remove your bandage (dressing). If soap and water are not available, use hand sanitizer. ? May remove dressing in 24 hours. 2. Check your insertion site every day for signs of infection. Check for: ? Redness, swelling, or pain. ? Fluid or blood. ? Pus or a bad smell. ? Warmth. 3. Do no take baths, swim, or use a hot tub for 5 days. 4. You may shower 24-48 hours after the procedure. ? Remove the dressing and gently wash the site with plain soap and water. ? Pat the area dry with a clean towel. ? Do not rub the site. That could cause bleeding. 5. Do not apply powder or lotion to the site. Activity  1. For 24 hours after the procedure, or as directed by your health care provider: ? Do not flex or bend the affected arm. ? Do not push or pull heavy objects with the affected arm. ? Do not drive yourself home from the hospital or clinic. You may drive 24 hours after the procedure. ? Do not operate machinery or power tools. ? KEEP ARM ELEVATED THE REMAINDER OF THE DAY. 2. Do not push, pull or lift anything that is heavier than 10 lb for 5 days. 3. Ask your health care provider when it is okay to: ? Return to work or school. ? Resume usual physical activities or sports. ? Resume sexual  activity. General instructions  If the catheter site starts to bleed, raise your arm and put firm pressure on the site. If the bleeding does not stop, get help right away. This is a medical emergency.  DRINK PLENTY OF FLUIDS FOR THE NEXT 2-3 DAYS.  No alcohol consumption for 24 hours after receiving sedation.  If you went home on the same day as your procedure, a responsible adult should be with you for the first 24 hours after you arrive home.  Keep all follow-up visits as told by your health care provider. This is important. Contact a health care provider if:  You have a fever.  You have redness, swelling, or yellow drainage around your insertion site. Get help right away if:  You have unusual pain at the radial site.  The catheter insertion area swells very fast.  The insertion area is bleeding, and the bleeding does not stop when you hold steady pressure on the area.  Your arm or hand becomes pale, cool, tingly, or numb. These symptoms may represent a serious problem that is an emergency. Do not wait to see if the symptoms will go away. Get medical help right away. Call your local emergency services (911 in the [place] Do not drive yourself to the hospital. Summary  After the procedure, it is common to have bruising and tenderness at the site.  Follow instructions from your health care provider about how to take care   of your radial site wound. Check the wound every day for signs of infection.  This information is not intended to replace advice given to you by your health care provider. Make sure you discuss any questions you have with your health care provider. Document Revised: 08/22/2017 Document Reviewed: 08/22/2017 Elsevier Patient Education  2020 [employer] 

## 2020-09-23 ENCOUNTER — Encounter: Payer: Self-pay | Admitting: Gastroenterology

## 2020-09-28 ENCOUNTER — Encounter: Payer: Self-pay | Admitting: Gastroenterology

## 2020-10-05 ENCOUNTER — Other Ambulatory Visit: Payer: Self-pay

## 2020-10-05 ENCOUNTER — Ambulatory Visit: Payer: 59 | Admitting: Physician Assistant

## 2020-10-05 ENCOUNTER — Encounter: Payer: Self-pay | Admitting: Physician Assistant

## 2020-10-05 VITALS — BP 116/68 | HR 84 | Ht 73.0 in | Wt 193.0 lb

## 2020-10-05 DIAGNOSIS — R9439 Abnormal result of other cardiovascular function study: Secondary | ICD-10-CM

## 2020-10-05 DIAGNOSIS — E782 Mixed hyperlipidemia: Secondary | ICD-10-CM

## 2020-10-05 DIAGNOSIS — I10 Essential (primary) hypertension: Secondary | ICD-10-CM

## 2020-10-05 DIAGNOSIS — R079 Chest pain, unspecified: Secondary | ICD-10-CM

## 2020-10-05 NOTE — Progress Notes (Signed)
Cardiology Office Note:    Date:  10/05/2020   ID:  Harry Molina, DOB 09-25-1970, MRN [id number]  PCP:  Chilton Greathouse, MD  [facility] Cardiologist:  Tonny Bollman, MD  [facility] Electrophysiologist:  None   Chief Complaint: cath follow up   History of Present Illness:    Harry Molina is a 50 y.o. male with a hx of  HTN, HLD and strong family hx of CAD presents for cath follow up.   Strong family hx of CAD. Father with first MI in his 72s. cousin just died suddenly at age 39.   The patient underwent testing in 2017, including a coronary Ca CT, echo, and exercise treadmill. He had a calcium score of zero,prior abnormaltreadmill studywith with normal stressecho with the exception of mild aortic valve insufficiencyin 2017. He had a repeat echo in 2018 showing stable, mild aortic insufficiency.  Seen by Norma Fredrickson, NP 08/30/20 for chest pain. Follow up stress echo was positive of ischemia with intermediate risk study. Follow up cath showed normal coronaries.   Here today for follow up.  He discontinued his Coreg after cath result.  No recurrent chest pain.  Denies shortness of breath, orthopnea, PND, syncope, lower extremity edema or melena.   Past Medical History:  Diagnosis Date  . Allergy    mild   . Celiac sprue    Resolved at age 63-13  . Hyperlipidemia   . Hypertension   . Lactose intolerance   . Mitral valve prolapse   . Pyloric stenosis   . Spontaneous pneumothorax    unsure side    Past Surgical History:  Procedure Laterality Date  . BIOPSY STOMACH    . LEFT HEART CATH AND CORONARY ANGIOGRAPHY N/A 09/21/2020   Procedure: LEFT HEART CATH AND CORONARY ANGIOGRAPHY;  Surgeon: Lyn Records, MD;  Location: [facility] INVASIVE CV LAB;  Service: Cardiovascular;  Laterality: N/A;  . OPEN REDUCTION INTERNAL FIXATION (ORIF) PROXIMAL PHALANX Right 09/04/2015   Procedure: OPEN REDUCTION INTERNAL FIXATION (ORIF) RIGHT SMALL FINGER PROXIMAL PHALANX;   Surgeon: Dominica Severin, MD;  Location: [facility] OR;  Service: Orthopedics;  Laterality: Right;  . pyloric stenosis    . UPPER GASTROINTESTINAL ENDOSCOPY     as a child - small bowel bx for celiac     Current Medications: Current Meds  Medication Sig  . aspirin EC 81 MG tablet Take 81 mg by mouth daily.  Marland Kitchen loratadine (CLARITIN) 10 MG tablet Take 10 mg by mouth daily.  . Multiple Vitamin tablet Take 1 tablet by mouth daily.  Marland Kitchen olmesartan (BENICAR) 40 MG tablet Take 20 mg by mouth daily.   . rosuvastatin (CRESTOR) 20 MG tablet Take 20 mg by mouth daily.     Allergies:   Patient has no known allergies.   Social History   Socioeconomic History  . Marital status: Married    Spouse name: Not on file  . Number of children: 3  . Years of education: College  . Highest education level: Not on file  Occupational History  . Occupation: [employer]   Tobacco Use  . Smoking status: Never Smoker  . Smokeless tobacco: Current User    Types: Snuff  . Tobacco comment: states he doesn't use smokeless tobacco "regularly"  Vaping Use  . Vaping Use: Never used  Substance and Sexual Activity  . Alcohol use: Yes    Alcohol/week: 0.0 standard drinks    Comment: social  . Drug use: No  . Sexual activity: Not  Currently  Other Topics Concern  . Not on file  Social History Narrative   Drinks about 3-4 diet cokes a day    Social Determinants of Health   Financial Resource Strain: Not on file  Food Insecurity: Not on file  Transportation Needs: Not on file  Physical Activity: Not on file  Stress: Not on file  Social Connections: Not on file     Family History: The patient's family history includes Esophageal cancer in his maternal grandfather; Heart attack in his father; Heart disease in his father; Hyperlipidemia in his father; Skin cancer in his mother. There is no history of Colon cancer, Stomach cancer, Pancreatic cancer, Liver disease, Colon polyps, or Rectal cancer.    ROS:   Please  see the history of present illness.    All other systems reviewed and are negative.  EKGs/Labs/Other Studies Reviewed:    The following studies were reviewed today:  LEFT HEART CATH AND CORONARY ANGIOGRAPHY    Conclusion   Normal coronary arteries.  Right dominant coronary anatomy.  Normal left ventricular function.  EF 55%.  Normal left ventricular end-diastolic pressure.  RECOMMENDATIONS:   Risk factor modification and long-term surveillance/management given the patient's young age.    EKG:  EKG is not ordered today.  Recent Labs: 09/16/2020: BUN 16; Creatinine, Ser 1.05; Hemoglobin 13.8; Platelets 232; Potassium 4.4; Sodium 139  Recent Lipid Panel No results found for: CHOL, TRIG, HDL, CHOLHDL, VLDL, LDLCALC, LDLDIRECT  Physical Exam:    VS:  BP 116/68   Pulse 84   Ht 6\' 1"  (1.854 m)   Wt 193 lb (87.5 kg)   SpO2 97%   BMI 25.46 kg/m     Wt Readings from Last 3 Encounters:  10/05/20 193 lb (87.5 kg)  09/21/20 189 lb (85.7 kg)  09/16/20 190 lb (86.2 kg)     GEN: Well nourished, well developed in no acute distress HEENT: Normal NECK: No JVD; No carotid bruits LYMPHATICS: No lymphadenopathy CARDIAC: RRR, no murmurs, rubs, gallops RESPIRATORY:  Clear to auscultation without rales, wheezing or rhonchi  ABDOMEN: Soft, non-tender, non-distended MUSCULOSKELETAL:  No edema; No deformity  SKIN: Warm and dry NEUROLOGIC:  Alert and oriented x 3 PSYCHIATRIC:  Normal affect   ASSESSMENT AND PLAN:    1. Chest pain/abnormal stress echo Cardiac cath without evidence of CAD.  No recurrent chest pain.  Likely false positive stress echo or may be stress induced cardiomyopathy as he was under a lot of stress at that time but normal EF.   2.  Hypertension -Blood pressure stable and controlled -Continue Benicar  3.  Hyperlipidemia -Continue statin  Medication Adjustments/Labs and Tests Ordered: Current medicines are reviewed at length with the patient today.   Concerns regarding medicines are outlined above.  No orders of the defined types were placed in this encounter.  No orders of the defined types were placed in this encounter.   Patient Instructions  Medication Instructions:  Your physician recommends that you continue on your current medications as directed. Please refer to the Current Medication list given to you today.  *If you need a refill on your cardiac medications before your next appointment, please call your pharmacy*   Lab Work: None ordered  If you have labs (blood work) drawn today and your tests are completely normal, you will receive your results only by: 09/18/20 MyChart Message (if you have MyChart) OR . A paper copy in the mail If you have any lab test that is abnormal or  we need to change your treatment, we will call you to review the results.   Testing/Procedures: None ordered   Follow-Up: At [facility], you and your health needs are our priority.  As part of our continuing mission to provide you with exceptional heart care, we have created designated Provider Care Teams.  These Care Teams include your primary Cardiologist (physician) and Advanced Practice Providers (APPs -  Physician Assistants and Nurse Practitioners) who all work together to provide you with the care you need, when you need it.  We recommend signing up for the patient portal called "MyChart".  Sign up information is provided on this After Visit Summary.  MyChart is used to connect with patients for Virtual Visits (Telemedicine).  Patients are able to view lab/test results, encounter notes, upcoming appointments, etc.  Non-urgent messages can be sent to your provider as well.   To learn more about what you can do with MyChart, go to [email or web address].    Your next appointment:   AS NEEDED     The format for your next appointment:     Provider:      Other Instructions      Signed, Manson Passey, [place]  10/05/2020 2:36 PM     [facility]

## 2020-10-05 NOTE — Patient Instructions (Signed)
Medication Instructions:  °Your physician recommends that you continue on your current medications as directed. Please refer to the Current Medication list given to you today. ° °*If you need a refill on your cardiac medications before your next appointment, please call your pharmacy* ° ° °Lab Work: °None ordered ° °If you have labs (blood work) drawn today and your tests are completely normal, you will receive your results only by: °MyChart Message (if you have MyChart) OR °A paper copy in the mail °If you have any lab test that is abnormal or we need to change your treatment, we will call you to review the results. ° ° °Testing/Procedures: °None ordered ° ° °Follow-Up: °At [facility], you and your health needs are our priority.  As part of our continuing mission to provide you with exceptional heart care, we have created designated Provider Care Teams.  These Care Teams include your primary Cardiologist (physician) and Advanced Practice Providers (APPs -  Physician Assistants and Nurse Practitioners) who all work together to provide you with the care you need, when you need it. ° °We recommend signing up for the patient portal called "MyChart".  Sign up information is provided on this After Visit Summary.  MyChart is used to connect with patients for Virtual Visits (Telemedicine).  Patients are able to view lab/test results, encounter notes, upcoming appointments, etc.  Non-urgent messages can be sent to your provider as well.   °To learn more about what you can do with MyChart, go to [email or web address].   ° °Your next appointment:   °AS NEEDED ° °The format for your next appointment:   ° ° °Provider:   °  ° ° °Other Instructions °  °

## 2020-10-21 ENCOUNTER — Encounter: Payer: Self-pay | Admitting: Gastroenterology

## 2020-10-25 ENCOUNTER — Encounter: Payer: Self-pay | Admitting: Gastroenterology

## 2020-10-26 ENCOUNTER — Other Ambulatory Visit: Payer: 59

## 2020-10-26 ENCOUNTER — Encounter: Payer: Self-pay | Admitting: Gastroenterology

## 2020-10-26 ENCOUNTER — Ambulatory Visit (INDEPENDENT_AMBULATORY_CARE_PROVIDER_SITE_OTHER): Payer: 59 | Admitting: Gastroenterology

## 2020-10-26 VITALS — HR 86 | Ht 73.0 in | Wt 196.0 lb

## 2020-10-26 DIAGNOSIS — Z8719 Personal history of other diseases of the digestive system: Secondary | ICD-10-CM

## 2020-10-26 DIAGNOSIS — K529 Noninfective gastroenteritis and colitis, unspecified: Secondary | ICD-10-CM | POA: Diagnosis not present

## 2020-10-26 DIAGNOSIS — R140 Abdominal distension (gaseous): Secondary | ICD-10-CM | POA: Diagnosis not present

## 2020-10-26 MED ORDER — PLENVU 140 G PO SOLR: 1.0000 | ORAL | 0 refills | Status: DC

## 2020-10-26 NOTE — Progress Notes (Unsigned)
oid

## 2020-10-26 NOTE — Progress Notes (Signed)
[facility] VISIT   Primary Care Provider [address], [place] [address] [phone]  Patient Profile: Harry Molina is a 50 y.o. male with a pmh significant for allergies, hypertension, hyperlipidemia, ?Prior Celiac Disease (though negative TTG while not on a GFD), lactose intolerance.  The patient presents to the [facility] for an evaluation and management of problem(s) noted below:  Problem List 1. Chronic diarrhea   2. Abdominal bloating   3. History of celiac disease     History of Present Illness Please see initial consultation note by [facility] for full details of HPI.  Interval History Patient returns for scheduled follow-up.  He is still experiencing episodes of loose bowel movements anywhere between 2 and 4/day.  Still has abdominal bloating that improves greater than 50% of the time with having a bowel movement.  No blood noted in his stools.  He is already scheduled for endoscopic evaluation in the coming months.  Patient does not take significant nonsteroidals or BC/Goody powders.  GI Review of Systems Positive as above Negative for pyrosis, dysphagia, odynophagia, nausea, vomiting, melena, hematochezia  Review of Systems General: Denies fevers/chills/weight loss unintentionally HEENT: Denies oral lesions Cardiovascular: Denies chest pain/palpitations Pulmonary: Denies shortness of breath Gastroenterological: See HPI Genitourinary: Denies darkened urine Hematological: Denies easy bruising/bleeding Endocrine: Denies temperature intolerance Dermatological: Denies jaundice Psychological: Mood is stable   Medications Current Outpatient Medications  Medication Sig Dispense Refill  . aspirin EC 81 MG tablet Take 81 mg by mouth daily.    Marland Kitchen loratadine (CLARITIN) 10 MG tablet Take 10 mg by mouth daily.    . Multiple Vitamin tablet Take 1 tablet by mouth daily.    Marland Kitchen olmesartan (BENICAR)  40 MG tablet Take 20 mg by mouth daily.     Marland Kitchen PEG-KCl-NaCl-NaSulf-Na Asc-C (PLENVU) 140 g SOLR Take 1 kit by mouth as directed. Use coupon: BIN: [id number] PNC: CNRX Group: [id number] ID: [id number] 1 each 0  . rosuvastatin (CRESTOR) 20 MG tablet Take 20 mg by mouth daily.     No current facility-administered medications for this visit.    Allergies No Known Allergies  Histories Past Medical History:  Diagnosis Date  . Allergy    mild   . Celiac sprue    Resolved at age 89-13  . Hyperlipidemia   . Hypertension   . Lactose intolerance   . Mitral valve prolapse   . Pyloric stenosis   . Spontaneous pneumothorax    unsure side   Past Surgical History:  Procedure Laterality Date  . BIOPSY STOMACH    . LEFT HEART CATH AND CORONARY ANGIOGRAPHY N/A 09/21/2020   Procedure: LEFT HEART CATH AND CORONARY ANGIOGRAPHY;  Surgeon: Belva Crome, MD;  Location: [facility] CV LAB;  Service: Cardiovascular;  Laterality: N/A;  . OPEN REDUCTION INTERNAL FIXATION (ORIF) PROXIMAL PHALANX Right 09/04/2015   Procedure: OPEN REDUCTION INTERNAL FIXATION (ORIF) RIGHT SMALL FINGER PROXIMAL PHALANX;  Surgeon: Roseanne Kaufman, MD;  Location: [facility];  Service: Orthopedics;  Laterality: Right;  . pyloric stenosis    . UPPER GASTROINTESTINAL ENDOSCOPY     as a child - small bowel bx for celiac    Social History   Socioeconomic History  . Marital status: Married    Spouse name: Not on file  . Number of children: 3  . Years of education: College  . Highest education level: Not on file  Occupational History  . Occupation: [employer]   Tobacco Use  .  Smoking status: Never Smoker  . Smokeless tobacco: Current User    Types: Snuff  . Tobacco comment: states he doesn't use smokeless tobacco "regularly"  Vaping Use  . Vaping Use: Never used  Substance and Sexual Activity  . Alcohol use: Yes    Alcohol/week: 0.0 standard drinks    Comment: social  . Drug use: No  . Sexual activity: Not Currently  Other  Topics Concern  . Not on file  Social History Narrative   Drinks about 3-4 diet cokes a day    Social Determinants of Health   Financial Resource Strain: Not on file  Food Insecurity: Not on file  Transportation Needs: Not on file  Physical Activity: Not on file  Stress: Not on file  Social Connections: Not on file  Intimate Partner Violence: Not on file   Family History  Problem Relation Age of Onset  . Heart disease Father   . Hyperlipidemia Father   . Heart attack Father   . Skin cancer Mother   . Esophageal cancer Maternal Grandfather   . Colon cancer Neg Hx   . Stomach cancer Neg Hx   . Pancreatic cancer Neg Hx   . Liver disease Neg Hx   . Colon polyps Neg Hx   . Rectal cancer Neg Hx   . Inflammatory bowel disease Neg Hx    I have reviewed his medical, social, and family history in detail and updated the electronic medical record as necessary.    PHYSICAL EXAMINATION  Pulse 86   Ht $R'6\' 1"'yF$  (1.854 m)   Wt 196 lb (88.9 kg)   BMI 25.86 kg/m  Wt Readings from Last 3 Encounters:  10/26/20 196 lb (88.9 kg)  10/05/20 193 lb (87.5 kg)  09/21/20 189 lb (85.7 kg)  GEN: NAD, appears stated age, doesn't appear chronically ill PSYCH: Cooperative, without pressured speech EYE: Conjunctivae pink, sclerae anicteric ENT: Masked CV: Nontachycardic RESP: No audible wheezing GI: NABS, soft, NT/ND, without rebound or guarding MSK/EXT: No lower extremity edema SKIN: No jaundice NEURO:  Alert & Oriented x 3, no focal deficits   REVIEW OF DATA  I reviewed the following data at the time of this encounter:  GI Procedures and Studies  No relevant studies to review  Laboratory Studies  Reviewed those in epic  Imaging Studies  No relevant studies to review   ASSESSMENT  [name] is a 50 y.o. male with a pmh significant for allergies, hypertension, hyperlipidemia, ?Prior Celiac Disease (though negative TTG while not on a GFD), lactose intolerance.  The patient is seen  today for evaluation and management of:  1. Chronic diarrhea   2. Abdominal bloating   3. History of celiac disease    The patient is hemodynamically stable.  Clinically he is unchanged from his last visit to clinic.  Endoscopic evaluation is next step for patient.  Fecal elastase testing to rule out EPI will be obtained.  We will consider SIBO evaluation in the future.  The risks and benefits of endoscopic evaluation were discussed with the patient; these include but are not limited to the risk of perforation, infection, bleeding, missed lesions, lack of diagnosis, severe illness requiring hospitalization, as well as anesthesia and sedation related illnesses.  The patient is agreeable to proceed.  All patient questions were answered to the best of my ability, and the patient agrees to the aforementioned plan of action with follow-up as indicated.   PLAN  Fecal elastase to be obtained Proceed with diagnostic upper  and screening lower endoscopy as already scheduled (gastric/duodenal/TI/colon biopsies to be obtained at minimum) Consider SIBO evaluation in future if negative work-up   Orders Placed This Encounter  Procedures  . Pancreatic elastase, fecal  . Ambulatory referral to Gastroenterology    New Prescriptions   PEG-KCL-NACL-NASULF-NA ASC-C (PLENVU) 140 G SOLR    Take 1 kit by mouth as directed. Use coupon: BIN: [id number] PNC: CNRX Group: [id number] ID: [id number]   Modified Medications   No medications on file    Planned Follow Up No follow-ups on file.   Total Time in Face-to-Face and in Coordination of Care for patient including independent/personal interpretation/review of prior testing, medical history, examination, medication adjustment, communicating results with the patient directly, and documentation with the EHR is 25 minutes.  Justice Britain, MD [facility] Advanced Endoscopy Office # [phone]

## 2020-10-26 NOTE — Patient Instructions (Signed)
If you are age 50 or older, your body mass index should be between 23-30. Your Body mass index is 25.86 kg/m. If this is out of the aforementioned range listed, please consider follow up with your Primary Care Provider.  If you are age 67 or younger, your body mass index should be between 19-25. Your Body mass index is 25.86 kg/m. If this is out of the aformentioned range listed, please consider follow up with your Primary Care Provider.  Your provider has requested that you go to the basement level for lab work before leaving today. Press "B" on the elevator. The lab is located at the first door on the left as you exit the elevator.   You have been scheduled for an endoscopy. Please follow written instructions given to you at your visit today. If you use inhalers (even only as needed), please bring them with you on the day of your procedure.

## 2020-10-28 ENCOUNTER — Encounter: Payer: Self-pay | Admitting: Gastroenterology

## 2020-11-02 ENCOUNTER — Other Ambulatory Visit: Payer: 59

## 2020-11-02 DIAGNOSIS — Z8719 Personal history of other diseases of the digestive system: Secondary | ICD-10-CM

## 2020-11-02 DIAGNOSIS — R140 Abdominal distension (gaseous): Secondary | ICD-10-CM

## 2020-11-02 DIAGNOSIS — K529 Noninfective gastroenteritis and colitis, unspecified: Secondary | ICD-10-CM

## 2020-11-08 LAB — PANCREATIC ELASTASE, FECAL: Pancreatic Elastase-1, Stool: >500 mcg/g

## 2020-12-07 ENCOUNTER — Encounter: Payer: Self-pay | Admitting: Gastroenterology

## 2020-12-16 ENCOUNTER — Other Ambulatory Visit: Payer: Self-pay

## 2020-12-16 ENCOUNTER — Encounter: Payer: Self-pay | Admitting: Gastroenterology

## 2020-12-16 ENCOUNTER — Ambulatory Visit (AMBULATORY_SURGERY_CENTER): Payer: 59 | Admitting: Gastroenterology

## 2020-12-16 VITALS — BP 130/87 | HR 72 | Temp 97.8°F | Resp 14 | Ht 73.0 in | Wt 196.0 lb

## 2020-12-16 DIAGNOSIS — K2970 Gastritis, unspecified, without bleeding: Secondary | ICD-10-CM | POA: Diagnosis not present

## 2020-12-16 DIAGNOSIS — R140 Abdominal distension (gaseous): Secondary | ICD-10-CM | POA: Diagnosis not present

## 2020-12-16 DIAGNOSIS — Z8719 Personal history of other diseases of the digestive system: Secondary | ICD-10-CM

## 2020-12-16 DIAGNOSIS — R197 Diarrhea, unspecified: Secondary | ICD-10-CM

## 2020-12-16 DIAGNOSIS — K529 Noninfective gastroenteritis and colitis, unspecified: Secondary | ICD-10-CM

## 2020-12-16 DIAGNOSIS — K449 Diaphragmatic hernia without obstruction or gangrene: Secondary | ICD-10-CM

## 2020-12-16 DIAGNOSIS — K3189 Other diseases of stomach and duodenum: Secondary | ICD-10-CM

## 2020-12-16 MED ORDER — SODIUM CHLORIDE 0.9 % IV SOLN: 500.0000 mL | Freq: Once | INTRAVENOUS | Status: DC

## 2020-12-16 NOTE — Patient Instructions (Signed)
Handouts given on gastritis, hiatal hernia, and hemorrhoids  YOU HAD AN ENDOSCOPIC PROCEDURE TODAY AT THE [facility]:   Refer to the procedure report that was given to you for any specific questions about what was found during the examination.  If the procedure report does not answer your questions, please call your gastroenterologist to clarify.  If you requested that your care partner not be given the details of your procedure findings, then the procedure report has been included in a sealed envelope for you to review at your convenience later.  YOU SHOULD EXPECT: Some feelings of bloating in the abdomen. Passage of more gas than usual.  Walking can help get rid of the air that was put into your GI tract during the procedure and reduce the bloating. If you had a lower endoscopy (such as a colonoscopy or flexible sigmoidoscopy) you may notice spotting of blood in your stool or on the toilet paper. If you underwent a bowel prep for your procedure, you may not have a normal bowel movement for a few days.  Please Note:  You might notice some irritation and congestion in your nose or some drainage.  This is from the oxygen used during your procedure.  There is no need for concern and it should clear up in a day or so.  SYMPTOMS TO REPORT IMMEDIATELY:   Following lower endoscopy (colonoscopy or flexible sigmoidoscopy):  Excessive amounts of blood in the stool  Significant tenderness or worsening of abdominal pains  Swelling of the abdomen that is new, acute  Fever of 100F or higher   Following upper endoscopy (EGD)  Vomiting of blood or coffee ground material  New chest pain or pain under the shoulder blades  Painful or persistently difficult swallowing  New shortness of breath  Fever of 100F or higher  Black, tarry-looking stools  For urgent or emergent issues, a gastroenterologist can be reached at any hour by calling [phone]. Do not use MyChart messaging for urgent  concerns.    DIET:  We do recommend a small meal at first, but then you may proceed to your regular diet.  Drink plenty of fluids but you should avoid alcoholic beverages for 24 hours.  ACTIVITY:  You should plan to take it easy for the rest of today and you should NOT DRIVE or use heavy machinery until tomorrow (because of the sedation medicines used during the test).    FOLLOW UP: Our staff will call the number listed on your records 48-72 hours following your procedure to check on you and address any questions or concerns that you may have regarding the information given to you following your procedure. If we do not reach you, we will leave a message.  We will attempt to reach you two times.  During this call, we will ask if you have developed any symptoms of COVID 19. If you develop any symptoms (ie: fever, flu-like symptoms, shortness of breath, cough etc.) before then, please call [phone].  If you test positive for Covid 19 in the 2 weeks post procedure, please call and report this information to [place].    If any biopsies were taken you will be contacted by phone or by letter within the next 1-3 weeks.  Please call us at [phone] if you have not heard about the biopsies in 3 weeks.    SIGNATURES/CONFIDENTIALITY: You and/or your care partner have signed paperwork which will be entered into your electronic medical record.  These signatures attest  to the fact that that the information above on your After Visit Summary has been reviewed and is understood.  Full responsibility of the confidentiality of this discharge information lies with you and/or your care-partner. 

## 2020-12-16 NOTE — Progress Notes (Signed)
1421 Robinul 0.1 mg IV given due large amount of secretions upon assessment.  MD made aware, vss  

## 2020-12-16 NOTE — Op Note (Signed)
[name]: Harry Molina Procedure Date: 12/16/2020 2:09 PM MRN: [id number] Endoscopist: Justice Britain , MD Age: 50 Referring MD:  Date of Birth: [birth year] Gender: Male Account #: [id number] Procedure:                Colonoscopy Indications:              Screening for colorectal malignant neoplasm,                            Incidental diarrhea noted Medicines:                Monitored Anesthesia Care Procedure:                Pre-Anesthesia Assessment:                           - Prior to the procedure, a History and Physical                            was performed, and patient medications and                            allergies were reviewed. The patient's tolerance of                            previous anesthesia was also reviewed. The risks                            and benefits of the procedure and the sedation                            options and risks were discussed with the patient.                            All questions were answered, and informed consent                            was obtained. Prior Anticoagulants: The patient has                            taken no previous anticoagulant or antiplatelet                            agents except for aspirin. ASA Grade Assessment: II                            - A patient with mild systemic disease. After                            reviewing the risks and benefits, the patient was                            deemed in satisfactory condition to undergo the  procedure.                           After obtaining informed consent, the colonoscope                            was passed under direct vision. Throughout the                            procedure, the patient's blood pressure, pulse, and                            oxygen saturations were monitored continuously. The                            Olympus CF-HQ190 [id number]) Colonoscope was                             introduced through the anus and advanced to the 6                            cm into the ileum. The colonoscopy was performed                            without difficulty. The patient tolerated the                            procedure. The quality of the bowel preparation was                            good. The terminal ileum, ileocecal valve,                            appendiceal orifice, and rectum were photographed. Scope In: 2:37:42 PM Scope Out: 2:50:28 PM Scope Withdrawal Time: 0 hours 10 minutes 32 seconds  Total Procedure Duration: 0 hours 12 minutes 46 seconds  Findings:                 The digital rectal exam findings include                            hemorrhoids. Pertinent negatives include no                            palpable rectal lesions.                           The terminal ileum and ileocecal valve appeared                            normal. Biopsies were taken with a cold forceps for                            histology.  Normal mucosa was found in the entire colon.                            Biopsies for histology were taken with a cold                            forceps from the entire colon for evaluation of                            microscopic colitis.                           Non-bleeding non-thrombosed external and internal                            hemorrhoids were found during retroflexion, during                            perianal exam and during digital exam. The                            hemorrhoids were Grade II (internal hemorrhoids                            that prolapse but reduce spontaneously). Complications:            No immediate complications. Estimated Blood Loss:     Estimated blood loss was minimal. Impression:               - Hemorrhoids found on digital rectal exam.                           - The examined portion of the ileum was normal.                            Biopsied.                            - Normal mucosa in the entire examined colon.                            Biopsied.                           - Non-bleeding non-thrombosed external and internal                            hemorrhoids. Recommendation:           - The patient will be observed post-procedure,                            until all discharge criteria are met.                           - Discharge patient to home.                           -  Patient has a contact number available for                            emergencies. The signs and symptoms of potential                            delayed complications were discussed with the                            patient. Return to normal activities tomorrow.                            Written discharge instructions were provided to the                            patient.                           - High fiber diet.                           - Benefiber or Metamucil 1-2 times daily to bulk                            stool if necessary can be considered.                           - Continue present medications.                           - Await pathology results.                           - Repeat colonoscopy in 10 years for screening                            purposes.                           - The findings and recommendations were discussed                            with the patient.                           - The findings and recommendations were discussed                            with the patient's family. Justice Britain, MD 12/16/2020 3:00:46 PM

## 2020-12-16 NOTE — Progress Notes (Signed)
Called to room to assist during endoscopic procedure.  Patient ID and intended procedure confirmed with present staff. Received instructions for my participation in the procedure from the performing physician.  

## 2020-12-16 NOTE — Progress Notes (Signed)
Medical history reviewed with no changes noted. VS assessed by J.D 

## 2020-12-16 NOTE — Op Note (Signed)
[facility] Patient Name: Harry Molina Procedure Date: 12/16/2020 2:15 PM MRN: [id number] Endoscopist: Corliss Parish , MD Age: 50 Referring MD:  Date of Birth: 1971-02-10 Gender: Male Account #: [id number] Procedure:                Upper GI endoscopy Indications:              Abdominal pain, Exclusion of celiac disease,                            Positive celiac serologies in past, Abdominal                            bloating, Diarrhea Medicines:                Monitored Anesthesia Care Procedure:                Pre-Anesthesia Assessment:                           - Prior to the procedure, a History and Physical                            was performed, and patient medications and                            allergies were reviewed. The patient's tolerance of                            previous anesthesia was also reviewed. The risks                            and benefits of the procedure and the sedation                            options and risks were discussed with the patient.                            All questions were answered, and informed consent                            was obtained. Prior Anticoagulants: The patient has                            taken no previous anticoagulant or antiplatelet                            agents except for aspirin. ASA Grade Assessment: II                            - A patient with mild systemic disease. After                            reviewing the risks and benefits, the patient was  deemed in satisfactory condition to undergo the                            procedure.                           After obtaining informed consent, the endoscope was                            passed under direct vision. Throughout the                            procedure, the patient's blood pressure, pulse, and                            oxygen saturations were monitored continuously. The                             Endoscope was introduced through the mouth, and                            advanced to the second part of duodenum. The upper                            GI endoscopy was accomplished without difficulty.                            The patient tolerated the procedure. Scope In: Scope Out: Findings:                 No gross lesions were noted in the entire esophagus.                           The Z-line was regular and was found 41 cm from the                            incisors.                           A 2 cm hiatal hernia was present. Estimated blood                            loss: none.                           Striped mildly erythematous mucosa without bleeding                            was found in the gastric antrum.                           No other gross lesions were noted in the entire                            examined stomach. Biopsies were taken with a cold  forceps for histology and Helicobacter pylori                            testing.                           No gross lesions were noted in the duodenal bulb,                            in the first portion of the duodenum and in the                            second portion of the duodenum. Biopsies for                            histology were taken with a cold forceps for                            evaluation of celiac disease. Complications:            No immediate complications. Estimated Blood Loss:     Estimated blood loss was minimal. Impression:               - No gross lesions in esophagus. Z-line regular, 41                            cm from the incisors.                           - 2 cm hiatal hernia.                           - Erythematous mucosa in the antrum. No other gross                            lesions in the stomach. Biopsied.                           - No gross lesions in the duodenal bulb, in the                            first portion of the duodenum and in  the second                            portion of the duodenum. Biopsied. Recommendation:           - Proceed to scheduled colonoscopy.                           - Await pathology results.                           - Observe patient's clinical course.                           - Continue present medications.                           -  The findings and recommendations were discussed                            with the patient.                           - The findings and recommendations were discussed                            with the patient's family. Corliss ParishGabriel Mansouraty, MD 12/16/2020 2:56:13 PM

## 2020-12-20 ENCOUNTER — Telehealth: Payer: Self-pay

## 2020-12-20 NOTE — Telephone Encounter (Signed)
  Follow up Call-  Call back number 12/16/2020  Post procedure Call Back phone  # [phone]  Permission to leave phone message Yes  Some recent data might be hidden     Patient questions:  Do you have a fever, pain , or abdominal swelling? No. Pain Score  0 *  Have you tolerated food without any problems? Yes.    Have you been able to return to your normal activities? Yes.    Do you have any questions about your discharge instructions: Diet   No. Medications  No. Follow up visit  No.  Do you have questions or concerns about your Care? No.  Actions: * If pain score is 4 or above: No action needed, pain <4.  1. Have you developed a fever since your procedure? no  2.   Have you had an respiratory symptoms (SOB or cough) since your procedure? no  3.   Have you tested positive for COVID 19 since your procedure no  4.   Have you had any family members/close contacts diagnosed with the COVID 19 since your procedure?  no   If yes to any of these questions please route to Laverna Peace, RN and Karlton Lemon, RN

## 2020-12-20 NOTE — Telephone Encounter (Signed)
Called [phone] and left a message we tried to reach pt for a follow up call. maw

## 2020-12-29 ENCOUNTER — Encounter: Payer: Self-pay | Admitting: Gastroenterology

## 2021-05-09 NOTE — Progress Notes (Signed)
Synopsis: Referred for dyspnea by Chilton Greathouse, MD  Subjective:   PATIENT ID: Harry Molina GENDER: male DOB: 1970-08-23, MRN: [id number]  Chief Complaint  Patient presents with   Consult    Patient had Covid in August. Patient states he has HX of spontaneous pneumo and has had shortness of breath for years but states that over the last couple of months it has got worse. States that it comes and goes its not really at any particular time.     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 He doesn't remember how he was treated but he doesn't think that he had chest tube. He remembers having subcutaneous emphysema. He was in hospital for 3-4 days.   He says he has shortness of breath which is not necessarily exertional. For instance exercised last week and felt totally normal. Talking can precipitate dyspnea though. Some associated chest achiness. He thinks he was feeling this way even a little bit before covid-19. He has no cough. No throat tightness or hoarseness when it happens.   Has had 3 vaccinations for covid-19.  Did do course of paxlovid for covid-19 earlier this year and was not hospitalized.  Otherwise pertinent review of systems is negative.  No family history of lung disease including pneumothorax. Does have early CAD in family.   Owns a Physiological scientist. No concerning exposures. Has a dog, cat. Has lived in [place] as a child but otherwise in [place]. Travels frequently but not this year.   Past Medical History:  Diagnosis Date   Allergy    mild    Celiac sprue    Resolved at age 50-13   Hyperlipidemia    Hypertension    Lactose intolerance    Mitral valve prolapse    Pyloric stenosis    Spontaneous pneumothorax    unsure side     Family History  Problem Relation Age of Onset   Heart disease Father     Hyperlipidemia Father    Heart attack Father    Skin cancer Mother    Esophageal cancer Maternal Grandfather    Colon cancer Neg Hx    Stomach cancer Neg Hx    Pancreatic cancer Neg Hx    Liver disease Neg Hx    Colon polyps Neg Hx    Rectal cancer Neg Hx    Inflammatory bowel disease Neg Hx      Past Surgical History:  Procedure Laterality Date   BIOPSY STOMACH     LEFT HEART CATH AND CORONARY ANGIOGRAPHY N/A 09/21/2020   Procedure: LEFT HEART CATH AND CORONARY ANGIOGRAPHY;  Surgeon: Lyn Records, MD;  Location: [facility] INVASIVE CV [facility];  Service: Cardiovascular;  Laterality: N/A;   OPEN REDUCTION INTERNAL FIXATION (ORIF) PROXIMAL PHALANX Right 09/04/2015   Procedure: OPEN REDUCTION INTERNAL FIXATION (ORIF) RIGHT SMALL FINGER PROXIMAL PHALANX;  Surgeon: Dominica Severin, MD;  Location: [facility] OR;  Service: Orthopedics;  Laterality: Right;   pyloric stenosis     UPPER GASTROINTESTINAL ENDOSCOPY     as a child - small bowel bx for celiac     Social History   Socioeconomic History   Marital status: Married    Spouse name: Not on file   Number of children: 3   Years of education: College   Highest education level: Not on file  Occupational History  Occupation: [employer]   Tobacco Use   Smoking status: Never   Smokeless tobacco: Current    Types: Snuff   Tobacco comments:    states he doesn't use smokeless tobacco "regularly"  Vaping Use   Vaping Use: Never used  Substance and Sexual Activity   Alcohol use: Yes    Alcohol/week: 0.0 standard drinks    Comment: social   Drug use: No   Sexual activity: Not Currently  Other Topics Concern   Not on file  Social History Narrative   Drinks about 3-4 diet cokes a day    Social Determinants of Health   Financial Resource Strain: Not on file  Food Insecurity: Not on file  Transportation Needs: Not on file  Physical Activity: Not on file  Stress: Not on file  Social Connections: Not on file  Intimate Partner Violence: Not on  file     No Known Allergies   Outpatient Medications Prior to Visit  Medication Sig Dispense Refill   aspirin EC 81 MG tablet Take 81 mg by mouth daily.     loratadine (CLARITIN) 10 MG tablet Take 10 mg by mouth daily.     Multiple Vitamin tablet Take 1 tablet by mouth daily.     olmesartan (BENICAR) 40 MG tablet Take 20 mg by mouth daily.      pantoprazole (PROTONIX) 40 MG tablet Take 40 mg by mouth 2 (two) times daily.     rosuvastatin (CRESTOR) 20 MG tablet Take 20 mg by mouth daily.     No facility-administered medications prior to visit.       Objective:   Physical Exam:  General appearance: 50 y.o., male, NAD, conversant, male, NAD, conversant  Eyes: anicteric sclerae, moist conjunctivae; no lid-lag; PERRL, tracking appropriately HENT: NCAT; oropharynx, MMM, no mucosal ulcerations; normal hard and soft palate Neck: Trachea midline; no lymphadenopathy, no JVD Lungs: CTAB, no crackles, no wheeze, with normal respiratory effort CV: RRR, no MRGs  Abdomen: Soft, non-tender; non-distended, BS present  Extremities: No peripheral edema, radial and DP pulses present bilaterally  Skin: Normal temperature, turgor and texture; no rash Psych: Appropriate affect Neuro: Alert and oriented to person and place, no focal deficit    Vitals:   05/10/21 1519  BP: 112/68  Pulse: 100  Temp: 98.2 F (36.8 C)  TempSrc: Oral  SpO2: 97%  Weight: 201 lb 6.4 oz (91.4 kg)  Height: 6\' 1"  (1.854 m)   97% on RA BMI Readings from Last 3 Encounters:  05/10/21 26.57 kg/m  12/16/20 25.86 kg/m  10/26/20 25.86 kg/m   Wt Readings from Last 3 Encounters:  05/10/21 201 lb 6.4 oz (91.4 kg)  12/16/20 196 lb (88.9 kg)  10/26/20 196 lb (88.9 kg)     CBC    Component Value Date/Time   WBC 5.5 09/16/2020 1120   WBC 5.8 12/07/2015 1453   RBC 4.51 09/16/2020 1120   RBC 4.56 12/07/2015 1453   HGB 13.8 09/16/2020 1120   HCT 42.3 09/16/2020 1120   PLT 232 09/16/2020 1120   MCV 94 09/16/2020 1120   MCH 30.6  09/16/2020 1120   MCH 30.3 12/07/2015 1453   MCHC 32.6 09/16/2020 1120   MCHC 33.3 12/07/2015 1453   RDW 12.0 09/16/2020 1120      Chest Imaging: CT Coronaries 08/2019 reviewed by me and unremarkable lung bases  Pulmonary Functions Testing Results: No flowsheet data found.  HSAT 08/2015 AHI 1  Echocardiogram:   TTE 06/2019: Trivial PCE  STress 08/2020: Conclusion(s)/Recommendation(s): Abnormal  echo stress test. There appears  to be hypokinesis with a hinge point in the mid to distal septal and  inferior walls. Overall EF improves with stress except for this small  portion. Hypertensive response to exercise  Heart Catheterization 08/2020:   Normal coronary arteries. Right dominant coronary anatomy. Normal left ventricular function.  EF 55%.  Normal left ventricular end-diastolic pressure.   RECOMMENDATIONS:   Risk factor modification and long-term surveillance/management given the patient's young age.     Assessment & Plan:   # Dyspnea: Episodic. Unclear etiology. He had clean cath. Symptoms don't sound overall suspicious for asthma and would expect more consistent dyspnea on exertion if there was alternative underlying parenchymal issue. His TTE did have trivial PCE but his chest tightness/dyspnea doesn't really have positional component suggestive of pericarditis.  Plan: - tsh, bnp - PFTs - CT Chest in case there's anything that predisposed him to pneumothorax in past - discuss possible sleep-disordered breathing at next visit     Omar Person, MD [facility] 05/10/2021 3:25 PM

## 2021-05-10 ENCOUNTER — Other Ambulatory Visit: Payer: Self-pay

## 2021-05-10 ENCOUNTER — Ambulatory Visit (INDEPENDENT_AMBULATORY_CARE_PROVIDER_SITE_OTHER): Payer: 59 | Admitting: Student

## 2021-05-10 ENCOUNTER — Encounter: Payer: Self-pay | Admitting: Student

## 2021-05-10 VITALS — BP 112/68 | HR 100 | Temp 98.2°F | Ht 73.0 in | Wt 201.4 lb

## 2021-05-10 DIAGNOSIS — J9383 Other pneumothorax: Secondary | ICD-10-CM | POA: Diagnosis not present

## 2021-05-10 DIAGNOSIS — R0600 Dyspnea, unspecified: Secondary | ICD-10-CM

## 2021-05-10 NOTE — Patient Instructions (Addendum)
-   CT Chest and breathing tests in the next 1-2 weeks, will call if there's anything alarming or that warrants treatment - labs today - next visit in 6 weeks

## 2021-05-11 LAB — TSH: TSH: 2.22 u[IU]/mL (ref 0.35–5.50)

## 2021-05-11 LAB — BRAIN NATRIURETIC PEPTIDE: Pro B Natriuretic peptide (BNP): 7 pg/mL (ref 0.0–100.0)

## 2021-05-30 ENCOUNTER — Other Ambulatory Visit: Payer: Self-pay

## 2021-05-30 ENCOUNTER — Ambulatory Visit (INDEPENDENT_AMBULATORY_CARE_PROVIDER_SITE_OTHER)
Admission: RE | Admit: 2021-05-30 | Discharge: 2021-05-30 | Disposition: A | Discharge status: Home / Self Care | Payer: 59 | Source: Ambulatory Visit | Attending: Student | Admitting: Student

## 2021-05-30 DIAGNOSIS — R0600 Dyspnea, unspecified: Secondary | ICD-10-CM | POA: Diagnosis not present

## 2021-06-26 NOTE — Progress Notes (Signed)
Synopsis: Referred for dyspnea by Chilton Greathouse, MD  Subjective:   PATIENT ID: Harry Molina GENDER: male DOB: [birth year], MRN: [id number]  Chief Complaint  Patient presents with   Follow-up    PFT review. He feels that he is doing better.     49yM with history of spontaneous PTX 24 years ago, HTN  Regarding his spontaneous PTX, he does say he played football at club level in college but doesn't recall any specific traumatic event. He doesn't remember how he was treated but he doesn't think that he had chest tube. He remembers having subcutaneous emphysema. He was in hospital for 3-4 days.   He says he has shortness of breath which is not necessarily exertional. For instance exercised last week and felt totally normal. Talking can precipitate dyspnea though. Some associated chest achiness. He thinks he was feeling this way even a little bit before covid-19. He has no cough. No throat tightness or hoarseness when it happens.   Has had 3 vaccinations for covid-19.  Did do course of paxlovid for covid-19 earlier this year and was not hospitalized.  No family history of lung disease including pneumothorax. Does have early CAD in family.   Owns a Physiological scientist. No concerning exposures. Has a dog, cat. Has lived in [place] as a child but otherwise in [place]. Travels frequently but not this year.   Interval HPI:  Last seen by me 05/10/21 at which point begain working up episodic dyspnea with TSH, bnp, CT Chest - all unremarkable. PFTs normal.  Still gets intermittent episodes of dyspnea which aren't exertional.   Has trouble staying awake during the day, snores, and wife observes occasional episodes of apnea.  Otherwise pertinent review of systems is negative.    Past Medical History:  Diagnosis Date   Allergy    mild    Celiac sprue    Resolved at age 20-13   Hyperlipidemia    Hypertension    Lactose intolerance    Mitral valve prolapse    Pyloric stenosis     Spontaneous pneumothorax    unsure side     Family History  Problem Relation Age of Onset   Heart disease Father    Hyperlipidemia Father    Heart attack Father    Skin cancer Mother    Esophageal cancer Maternal Grandfather    Colon cancer Neg Hx    Stomach cancer Neg Hx    Pancreatic cancer Neg Hx    Liver disease Neg Hx    Colon polyps Neg Hx    Rectal cancer Neg Hx    Inflammatory bowel disease Neg Hx      Past Surgical History:  Procedure Laterality Date   BIOPSY STOMACH     LEFT HEART CATH AND CORONARY ANGIOGRAPHY N/A 09/21/2020   Procedure: LEFT HEART CATH AND CORONARY ANGIOGRAPHY;  Surgeon: Lyn Records, MD;  Location: [facility];  Service: Cardiovascular;  Laterality: N/A;   OPEN REDUCTION INTERNAL FIXATION (ORIF) PROXIMAL PHALANX Right 09/04/2015   Procedure: OPEN REDUCTION INTERNAL FIXATION (ORIF) RIGHT SMALL FINGER PROXIMAL PHALANX;  Surgeon: Dominica Severin, MD;  Location: [facility] OR;  Service: Orthopedics;  Laterality: Right;   pyloric stenosis     UPPER GASTROINTESTINAL ENDOSCOPY     as a child - small bowel bx for celiac     Social History   Socioeconomic History   Marital status: Married    Spouse name: Not on file   Number of children: 3  Years of education: College   Highest education level: Not on file  Occupational History   Occupation: [employer]   Tobacco Use   Smoking status: Never   Smokeless tobacco: Current    Types: Snuff   Tobacco comments:    states he doesn't use smokeless tobacco "regularly"  Vaping Use   Vaping Use: Never used  Substance and Sexual Activity   Alcohol use: Yes    Alcohol/week: 0.0 standard drinks    Comment: social   Drug use: No   Sexual activity: Not Currently  Other Topics Concern   Not on file  Social History Narrative   Drinks about 3-4 diet cokes a day    Social Determinants of Health   Financial Resource Strain: Not on file  Food Insecurity: Not on file  Transportation Needs: Not on file   Physical Activity: Not on file  Stress: Not on file  Social Connections: Not on file  Intimate Partner Violence: Not on file     No Known Allergies   Outpatient Medications Prior to Visit  Medication Sig Dispense Refill   albuterol (VENTOLIN HFA) 108 (90 Base) MCG/ACT inhaler SMARTSIG:1-2 Puff(s) Via Inhaler Every 4 Hours PRN     aspirin EC 81 MG tablet Take 81 mg by mouth daily.     loratadine (CLARITIN) 10 MG tablet Take 10 mg by mouth daily.     Multiple Vitamin tablet Take 1 tablet by mouth daily.     olmesartan (BENICAR) 40 MG tablet Take 20 mg by mouth daily.      rosuvastatin (CRESTOR) 20 MG tablet Take 20 mg by mouth daily.     pantoprazole (PROTONIX) 40 MG tablet Take 40 mg by mouth 2 (two) times daily. (Patient not taking: Reported on 06/27/2021)     No facility-administered medications prior to visit.       Objective:   Physical Exam:  General appearance: 50 y.o., male, NAD, conversant  Eyes: anicteric sclerae; PERRL, tracking appropriately HENT: NCAT; MMM Neck: Trachea midline; no lymphadenopathy, no JVD Lungs: CTAB, no crackles, no wheeze, with normal respiratory effort CV: RRR, no murmur  Abdomen: Soft, non-tender; non-distended, BS present  Extremities: No peripheral edema, warm Skin: Normal turgor and texture; no rash Psych: Appropriate affect Neuro: Alert and oriented to person and place, no focal deficit     Vitals:   06/27/21 1612  BP: 116/70  Pulse: 95  Temp: (!) 97.3 F (36.3 C)  TempSrc: Oral  SpO2: 97%  Weight: 203 lb (92.1 kg)  Height: 6' (1.829 m)    97% on RA BMI Readings from Last 3 Encounters:  06/27/21 27.53 kg/m  05/10/21 26.57 kg/m  12/16/20 25.86 kg/m   Wt Readings from Last 3 Encounters:  06/27/21 203 lb (92.1 kg)  05/10/21 201 lb 6.4 oz (91.4 kg)  12/16/20 196 lb (88.9 kg)     CBC    Component Value Date/Time   WBC 5.5 09/16/2020 1120   WBC 5.8 12/07/2015 1453   RBC 4.51 09/16/2020 1120   RBC 4.56  12/07/2015 1453   HGB 13.8 09/16/2020 1120   HCT 42.3 09/16/2020 1120   PLT 232 09/16/2020 1120   MCV 94 09/16/2020 1120   MCH 30.6 09/16/2020 1120   MCH 30.3 12/07/2015 1453   MCHC 32.6 09/16/2020 1120   MCHC 33.3 12/07/2015 1453   RDW 12.0 09/16/2020 1120      Chest Imaging: CT Coronaries 08/2019 reviewed by me and unremarkable lung bases  CT Chest 05/30/21  reviewed by me with only 45mm LLL nodule, otherwise unremarkable  Pulmonary Functions Testing Results: PFT Results Latest Ref Rng & Units 06/27/2021  FVC-Pre L 4.94  FVC-Predicted Pre % 90  FVC-Post L 4.92  FVC-Predicted Post % 90  Pre FEV1/FVC % % 85  Post FEV1/FCV % % 87  FEV1-Pre L 4.20  FEV1-Predicted Pre % 99  FEV1-Post L 4.28  DLCO uncorrected ml/min/mmHg 37.09  DLCO UNC% % 118  DLCO corrected ml/min/mmHg 37.09  DLCO COR %Predicted % 118  DLVA Predicted % 122  TLC L 6.47  TLC % Predicted % 87  RV % Predicted % 59  Reviewed by me, normal PFT  HSAT 08/2015 AHI 1  Echocardiogram:   TTE 06/2019: Trivial PCE  STress 08/2020: Conclusion(s)/Recommendation(s): Abnormal echo stress test. There appears  to be hypokinesis with a hinge point in the mid to distal septal and  inferior walls. Overall EF improves with stress except for this small  portion. Hypertensive response to exercise  Heart Catheterization 08/2020:   Normal coronary arteries. Right dominant coronary anatomy. Normal left ventricular function.  EF 55%.  Normal left ventricular end-diastolic pressure.   RECOMMENDATIONS:   Risk factor modification and long-term surveillance/management given the patient's young age.     Assessment & Plan:   # Dyspnea: Episodic. Unclear etiology. He had clean cath. Symptoms don't sound overall suspicious for asthma and would expect more consistent dyspnea on exertion if there was alternative underlying parenchymal issue and PFTs normal. His TTE did have trivial PCE but his chest tightness/dyspnea doesn't  really have positional component suggestive of pericarditis. Given absence of clear relationship to exertion I also question the utility of exercise testing.  # Excessive daytime sleepiness # Snoring  Plan: - methacholine challenge - in-lab PSG      Maryjane Hurter, MD [facility] 06/27/2021 6:25 PM

## 2021-06-27 ENCOUNTER — Ambulatory Visit (INDEPENDENT_AMBULATORY_CARE_PROVIDER_SITE_OTHER): Payer: 59 | Admitting: Student

## 2021-06-27 ENCOUNTER — Other Ambulatory Visit: Payer: Self-pay

## 2021-06-27 ENCOUNTER — Encounter: Payer: Self-pay | Admitting: Student

## 2021-06-27 VITALS — BP 116/70 | HR 95 | Temp 97.3°F | Ht 72.0 in | Wt 203.0 lb

## 2021-06-27 DIAGNOSIS — G4719 Other hypersomnia: Secondary | ICD-10-CM

## 2021-06-27 DIAGNOSIS — R0600 Dyspnea, unspecified: Secondary | ICD-10-CM

## 2021-06-27 LAB — PULMONARY FUNCTION TEST
DL/VA % pred: 122 %
DL/VA: 5.42 ml/min/mmHg/L
DLCO cor % pred: 118 %
DLCO cor: 37.09 ml/min/mmHg
DLCO unc % pred: 118 %
DLCO unc: 37.09 ml/min/mmHg
FEF 25-75 Post: 5.36 L/sec
FEF 25-75 Pre: 5.09 L/sec
FEF2575-%Change-Post: 5 %
FEF2575-%Pred-Post: 144 %
FEF2575-%Pred-Pre: 136 %
FEV1-%Change-Post: 1 %
FEV1-%Pred-Post: 101 %
FEV1-%Pred-Pre: 99 %
FEV1-Post: 4.28 L
FEV1-Pre: 4.2 L
FEV1FVC-%Change-Post: 2 %
FEV1FVC-%Pred-Pre: 109 %
FEV6-%Change-Post: 0 %
FEV6-%Pred-Post: 93 %
FEV6-%Pred-Pre: 94 %
FEV6-Post: 4.92 L
FEV6-Pre: 4.94 L
FEV6FVC-%Pred-Post: 103 %
FEV6FVC-%Pred-Pre: 103 %
FVC-%Change-Post: 0 %
FVC-%Pred-Post: 90 %
FVC-%Pred-Pre: 90 %
FVC-Post: 4.92 L
FVC-Pre: 4.94 L
Post FEV1/FVC ratio: 87 %
Post FEV6/FVC ratio: 100 %
Pre FEV1/FVC ratio: 85 %
Pre FEV6/FVC Ratio: 100 %
RV % pred: 59 %
RV: 1.26 L
TLC % pred: 87 %
TLC: 6.47 L

## 2021-06-27 NOTE — Progress Notes (Signed)
Full PFT performed today. °

## 2021-06-27 NOTE — Patient Instructions (Addendum)
-   you will be called to schedule methacholine challenge and  - see you in 4 months!

## 2021-06-27 NOTE — Patient Instructions (Signed)
Full PFT performed today. °

## 2021-08-10 ENCOUNTER — Other Ambulatory Visit: Payer: Self-pay

## 2021-08-10 ENCOUNTER — Ambulatory Visit (HOSPITAL_BASED_OUTPATIENT_CLINIC_OR_DEPARTMENT_OTHER): Payer: 59 | Attending: Student | Admitting: Pulmonary Disease

## 2021-08-10 ENCOUNTER — Telehealth: Payer: Self-pay | Admitting: Student

## 2021-08-10 DIAGNOSIS — G4733 Obstructive sleep apnea (adult) (pediatric): Secondary | ICD-10-CM | POA: Diagnosis not present

## 2021-08-10 DIAGNOSIS — G4719 Other hypersomnia: Secondary | ICD-10-CM | POA: Diagnosis present

## 2021-08-10 NOTE — Telephone Encounter (Signed)
Per AO, pt's insurance would not authorize it at this time.  We will keep an eye on it to see if it's authorized.  Pt does not want to r/s at this time.

## 2021-08-10 NOTE — Telephone Encounter (Signed)
This will need to be a peer to peer. I have the paperwork to complete.  Notification number is [id number].  Pt's appt is tonight.

## 2021-08-10 NOTE — Telephone Encounter (Signed)
Provider AO is working on this. Will update note once we have a response.

## 2021-08-11 DIAGNOSIS — G4719 Other hypersomnia: Secondary | ICD-10-CM | POA: Diagnosis not present

## 2021-08-11 NOTE — Procedures (Signed)
° ° °  Patient Name: Harry Molina, Harry Molina Date: 08/10/2021 Gender: Male D.O.B: [birth year] Age (years): 50 Referring Provider: Omar Person Height (inches): 73 Interpreting Physician: Coralyn Helling MD, ABSM Weight (lbs): 197 RPSGT: Lowry Ram BMI: 26 MRN: [id number] Neck Size: 15.50  CLINICAL INFORMATION Sleep Study Type: NPSG  Indication for sleep study: Fatigue, Snoring  Epworth Sleepiness Score: 11  SLEEP STUDY TECHNIQUE As per the AASM Manual for the Scoring of Sleep and Associated Events v2.3 (April 2016) with a hypopnea requiring 4% desaturations.  The channels recorded and monitored were frontal, central and occipital EEG, electrooculogram (EOG), submentalis EMG (chin), nasal and oral airflow, thoracic and abdominal wall motion, anterior tibialis EMG, snore microphone, electrocardiogram, and pulse oximetry.  MEDICATIONS Medications self-administered by patient taken the night of the study : N/A  SLEEP ARCHITECTURE The study was initiated at 9:57:52 PM and ended at 5:04:23 AM.  Sleep onset time was 46.1 minutes and the sleep efficiency was 70.6%%. The total sleep time was 301 minutes.  Stage REM latency was 115.0 minutes.  The patient spent 8.1%% of the night in stage N1 sleep, 66.1%% in stage N2 sleep, 0.0%% in stage N3 and 25.8% in REM.  Alpha intrusion was absent.  Supine sleep was 18.71%.  RESPIRATORY PARAMETERS The overall apnea/hypopnea index (AHI) was 9.2 per hour. There were 0 total apneas, including 0 obstructive, 0 central and 0 mixed apneas. There were 46 hypopneas and 12 RERAs.  The AHI during Stage REM sleep was 1.5 per hour.  AHI while supine was 32.0 per hour.  The mean oxygen saturation was 94.2%. The minimum SpO2 during sleep was 89.0%.  loud snoring was noted during this study.  CARDIAC DATA The 2 lead EKG demonstrated sinus rhythm. The mean heart rate was 67.9 beats per minute. Other EKG findings include: None.  LEG  MOVEMENT DATA The total PLMS were 0 with a resulting PLMS index of 0.0. Associated arousal with leg movement index was 3.8 .  IMPRESSIONS - Mild obstructive sleep apnea with an overall AHI of 9.2/h and SpO2 low of 89%. - Significant positional component with supine AHI of 32/h. - The patient snored with loud snoring volume.  DIAGNOSIS - Obstructive Sleep Apnea (G47.33)  RECOMMENDATIONS - Additional therapies include weight loss, positional therapy, CPAP, oral appliance, or surgical assessment. - Avoid alcohol, sedatives and other CNS depressants that may worsen sleep apnea and disrupt normal sleep architecture. - Sleep hygiene should be reviewed to assess factors that may improve sleep quality.  [Electronically signed] 08/11/2021 04:55 PM  Coralyn Helling MD, ABSM Diplomate, American Board of Sleep Medicine NPI: [id number]  [facility] PH: [phone]   FX: [phone] ACCREDITED BY THE AMERICAN ACADEMY OF SLEEP MEDICINE

## 2022-05-01 ENCOUNTER — Emergency Department (HOSPITAL_BASED_OUTPATIENT_CLINIC_OR_DEPARTMENT_OTHER): Payer: 59

## 2022-05-01 ENCOUNTER — Other Ambulatory Visit: Payer: Self-pay

## 2022-05-01 ENCOUNTER — Emergency Department (HOSPITAL_BASED_OUTPATIENT_CLINIC_OR_DEPARTMENT_OTHER)
Admission: EM | Admit: 2022-05-01 | Discharge: 2022-05-01 | Disposition: A | Discharge status: Home / Self Care | Payer: 59 | Attending: Emergency Medicine | Admitting: Emergency Medicine

## 2022-05-01 ENCOUNTER — Encounter (HOSPITAL_BASED_OUTPATIENT_CLINIC_OR_DEPARTMENT_OTHER): Payer: Self-pay

## 2022-05-01 DIAGNOSIS — Z7982 Long term (current) use of aspirin: Secondary | ICD-10-CM | POA: Diagnosis not present

## 2022-05-01 DIAGNOSIS — R140 Abdominal distension (gaseous): Secondary | ICD-10-CM | POA: Insufficient documentation

## 2022-05-01 DIAGNOSIS — Z79899 Other long term (current) drug therapy: Secondary | ICD-10-CM | POA: Diagnosis not present

## 2022-05-01 DIAGNOSIS — R1032 Left lower quadrant pain: Secondary | ICD-10-CM | POA: Diagnosis present

## 2022-05-01 DIAGNOSIS — K5730 Diverticulosis of large intestine without perforation or abscess without bleeding: Secondary | ICD-10-CM | POA: Diagnosis not present

## 2022-05-01 DIAGNOSIS — K5732 Diverticulitis of large intestine without perforation or abscess without bleeding: Secondary | ICD-10-CM

## 2022-05-01 LAB — CBC
HCT: 40.2 % (ref 39.0–52.0)
Hemoglobin: 13.7 g/dL (ref 13.0–17.0)
MCH: 31.7 pg (ref 26.0–34.0)
MCHC: 34.1 g/dL (ref 30.0–36.0)
MCV: 93.1 fL (ref 80.0–100.0)
Platelets: 216 10*3/uL (ref 150–400)
RBC: 4.32 MIL/uL (ref 4.22–5.81)
RDW: 11.7 % (ref 11.5–15.5)
WBC: 7 10*3/uL (ref 4.0–10.5)
nRBC: 0 % (ref 0.0–0.2)

## 2022-05-01 LAB — URINALYSIS, ROUTINE W REFLEX MICROSCOPIC
Bilirubin Urine: NEGATIVE
Glucose, UA: NEGATIVE mg/dL
Hgb urine dipstick: NEGATIVE
Ketones, ur: NEGATIVE mg/dL
Leukocytes,Ua: NEGATIVE
Nitrite: NEGATIVE
Protein, ur: NEGATIVE mg/dL
Specific Gravity, Urine: 1.03 (ref 1.005–1.030)
pH: 5.5 (ref 5.0–8.0)

## 2022-05-01 LAB — COMPREHENSIVE METABOLIC PANEL
ALT: 34 U/L (ref 0–44)
AST: 35 U/L (ref 15–41)
Albumin: 4.9 g/dL (ref 3.5–5.0)
Alkaline Phosphatase: 42 U/L (ref 38–126)
Anion gap: 10 (ref 5–15)
BUN: 19 mg/dL (ref 6–20)
CO2: 26 mmol/L (ref 22–32)
Calcium: 9.7 mg/dL (ref 8.9–10.3)
Chloride: 104 mmol/L (ref 98–111)
Creatinine, Ser: 1.08 mg/dL (ref 0.61–1.24)
GFR, Estimated: >60 mL/min (ref >60)
Glucose, Bld: 105 mg/dL — ABNORMAL HIGH (ref 70–99)
Potassium: 4.1 mmol/L (ref 3.5–5.1)
Sodium: 140 mmol/L (ref 135–145)
Total Bilirubin: 0.6 mg/dL (ref 0.3–1.2)
Total Protein: 7.3 g/dL (ref 6.5–8.1)

## 2022-05-01 LAB — LIPASE, BLOOD: Lipase: 42 U/L (ref 11–51)

## 2022-05-01 MED ORDER — AMOXICILLIN-POT CLAVULANATE 875-125 MG PO TABS
1.0000 | ORAL_TABLET | Freq: Once | ORAL | Status: AC
  Administered 2022-05-01: 1 via ORAL
  Filled 2022-05-01: qty 1

## 2022-05-01 MED ORDER — AMOXICILLIN-POT CLAVULANATE 875-125 MG PO TABS: 1.0000 | ORAL_TABLET | Freq: Two times a day (BID) | ORAL | 0 refills | Status: DC

## 2022-05-01 MED ORDER — OXYCODONE-ACETAMINOPHEN 5-325 MG PO TABS: 1.0000 | ORAL_TABLET | Freq: Four times a day (QID) | ORAL | 0 refills | Status: DC | PRN

## 2022-05-01 MED ORDER — IOHEXOL 300 MG/ML  SOLN
100.0000 mL | Freq: Once | INTRAMUSCULAR | Status: AC | PRN
  Administered 2022-05-01: 85 mL via INTRAVENOUS

## 2022-05-01 NOTE — ED Triage Notes (Signed)
Pt states that he has been having LLQ abd pain for the past day, denies n/v/d/fevers.

## 2022-05-01 NOTE — Discharge Instructions (Addendum)
You were seen today for abdominal pain.  Your CT scan shows mild diverticulitis.  Take antibiotics as prescribed.  If you develop worsening pain, fevers, any new or worsening symptoms, you should be reevaluated.

## 2022-05-01 NOTE — ED Provider Notes (Signed)
[facility] EMERGENCY DEPT Provider Note   CSN: [id number] Arrival date & time: 05/01/22  0100     History  Chief Complaint  Patient presents with   Abdominal Pain    Harry Molina is a 51 y.o. male.  HPI     This is a 51 year old male who presents with 2-day history of worsening left lower quadrant pain.  Patient reports he has had 2 days of progressively worsening left lower quadrant pain.  He also feels like he is bloated.  No nausea, vomiting, diarrhea, fevers.  Denies urinary symptoms such as hematuria or dysuria.  Patient has no known history of kidney stones or diverticulitis.  He has not taken anything for his pain.  Denies testicular pain or swelling.  Home Medications Prior to Admission medications   Medication Sig Start Date End Date Taking? Authorizing Provider  amoxicillin-clavulanate (AUGMENTIN) 875-125 MG tablet Take 1 tablet by mouth every 12 (twelve) hours. 05/01/22  Yes Shellene Sweigert, Barbette Hair, MD  oxyCODONE-acetaminophen (PERCOCET/ROXICET) 5-325 MG tablet Take 1 tablet by mouth every 6 (six) hours as needed for severe pain. 05/01/22  Yes Shayn Madole, Barbette Hair, MD  albuterol (VENTOLIN HFA) 108 (90 Base) MCG/ACT inhaler SMARTSIG:1-2 Puff(s) Via Inhaler Every 4 Hours PRN 04/12/21   [provider]  aspirin EC 81 MG tablet Take 81 mg by mouth daily.    [provider]  loratadine (CLARITIN) 10 MG tablet Take 10 mg by mouth daily.    [provider]  Multiple Vitamin tablet Take 1 tablet by mouth daily.    [provider]  olmesartan (BENICAR) 40 MG tablet Take 20 mg by mouth daily.     [provider]  rosuvastatin (CRESTOR) 20 MG tablet Take 20 mg by mouth daily.    [provider]      Allergies    Patient has no known allergies.    Review of Systems   Review of Systems  Constitutional:  Negative for fever.  Gastrointestinal:  Positive for abdominal pain. Negative for diarrhea, nausea and  vomiting.  All other systems reviewed and are negative.   Physical Exam Updated Vital Signs BP (!) 136/94   Pulse 81   Temp 98 F (36.7 C) (Temporal)   Resp 18   Ht 1.829 m (6')   Wt 88.5 kg   SpO2 98%   BMI 26.45 kg/m  Physical Exam Vitals and nursing note reviewed.  Constitutional:      Appearance: He is well-developed. He is not ill-appearing.  HENT:     Head: Normocephalic and atraumatic.  Eyes:     Pupils: Pupils are equal, round, and reactive to light.  Cardiovascular:     Rate and Rhythm: Normal rate and regular rhythm.     Heart sounds: Normal heart sounds. No murmur heard. Pulmonary:     Effort: Pulmonary effort is normal. No respiratory distress.     Breath sounds: Normal breath sounds. No wheezing.  Abdominal:     General: Bowel sounds are normal.     Palpations: Abdomen is soft.     Tenderness: There is abdominal tenderness in the left lower quadrant. There is guarding. There is no rebound.  Musculoskeletal:     Cervical back: Neck supple.  Lymphadenopathy:     Cervical: No cervical adenopathy.  Skin:    General: Skin is warm and dry.  Neurological:     Mental Status: He is alert and oriented to person, place, and time.  Psychiatric:  Mood and Affect: Mood normal.     ED Results / Procedures / Treatments   Labs (all labs ordered are listed, but only abnormal results are displayed) Labs Reviewed  COMPREHENSIVE METABOLIC PANEL - Abnormal; Notable for the following components:      Result Value   Glucose, Bld 105 (*)    All other components within normal limits  LIPASE, BLOOD  CBC  URINALYSIS, ROUTINE W REFLEX MICROSCOPIC    EKG None  Radiology CT ABDOMEN PELVIS W CONTRAST  Result Date: 05/01/2022 CLINICAL DATA:  Left lower quadrant abdominal pain. EXAM: CT ABDOMEN AND PELVIS WITH CONTRAST TECHNIQUE: Multidetector CT imaging of the abdomen and pelvis was performed using the standard protocol following bolus administration of intravenous  contrast. RADIATION DOSE REDUCTION: This exam was performed according to the departmental dose-optimization program which includes automated exposure control, adjustment of the mA and/or kV according to patient size and/or use of iterative reconstruction technique. CONTRAST:  106mL OMNIPAQUE IOHEXOL 300 MG/ML  SOLN COMPARISON:  None Available. FINDINGS: Lower chest: No acute abnormality. Hepatobiliary: No focal liver abnormality is seen. No gallstones, gallbladder wall thickening, or biliary dilatation. Pancreas: Unremarkable. No pancreatic ductal dilatation or surrounding inflammatory changes. Spleen: Normal in size without focal abnormality. Adrenals/Urinary Tract: The adrenal glands are within normal limits. The kidneys enhance symmetrically. A few scattered subcentimeter hypodensities are noted bilaterally which are too small to further characterize. No renal or ureteral calculus or obstructive uropathy. The bladder is unremarkable. Stomach/Bowel: The stomach is within normal limits. Scattered diverticula are present along the colon with mild surrounding fat stranding in the left lower quadrant at the proximal sigmoid colon, compatible with diverticulitis. No abscess or free air. No pneumatosis. A normal appendix is present in the right lower quadrant. Vascular/Lymphatic: Aortic atherosclerosis. No enlarged abdominal or pelvic lymph nodes. Reproductive: Prostate is unremarkable. Other: No abdominopelvic ascites. Small fat containing left inguinal hernia. Small fat containing umbilical hernia. Musculoskeletal: Degenerative changes are present in the lumbar spine at L4-L5 and L5-S1. 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 No acute osseous abnormality. IMPRESSION: Mild sigmoid diverticulitis in the left lower quadrant. No free air or abscess. Electronically Signed   By: Thornell Sartorius M.D.   On: 05/01/2022 05:00    Procedures Procedures    Medications Ordered in  ED Medications  amoxicillin-clavulanate (AUGMENTIN) 875-125 MG per tablet 1 tablet (has no administration in time range)  iohexol (OMNIPAQUE) 300 MG/ML solution 100 mL (85 mLs Intravenous Contrast Given 05/01/22 0434)    ED Course/ Medical Decision Making/ A&P                           Medical Decision Making Amount and/or Complexity of Data Reviewed Labs: ordered. Radiology: ordered.  Risk Prescription drug management.   This patient presents to the ED for concern of left lower quadrant pain, this involves an extensive number of treatment options, and is a complaint that carries with it a high risk of complications and morbidity.  I considered the following differential and admission for this acute, potentially life threatening condition.  The differential diagnosis includes diverticulitis, colitis, kidney stone, UTI  MDM:    This is a 51 year old male who presents with left lower quadrant pain.  He is overall nontoxic-appearing and vital signs are reassuring.  He is afebrile.  He does have some tenderness with voluntary guarding in the left lower quadrant.  Labs obtained and reviewed.  No  leukocytosis.  No metabolic derangements failure and LFTs and lipase are normal.  Given location of pain, would be highly suspicious for diverticulitis.  Given absence of lab abnormalities or systemic symptoms, this would likely be mild diverticulitis.  Discussed with the patient presumptively treating with antibiotics versus obtaining a CT scan.  Patient states that he is concerned and would like to get a CT scan.  This is not unreasonable.  He declined pain medication.  CT scan confirms mild diverticulitis without complication.  He was given a dose of Augmentin.  Will discharge with Augmentin and Percocet for pain.  He was given strict return precautions.  (Labs, imaging, consults)  Labs: I Ordered, and personally interpreted labs.  The pertinent results include: CBC, CMP, lipase, urinalysis  Imaging  Studies ordered: I ordered imaging studies including CT study I independently visualized and interpreted imaging. I agree with the radiologist interpretation  Additional history obtained from chart review.  External records from outside source obtained and reviewed including prior evaluations  Cardiac Monitoring: The patient was maintained on a cardiac monitor.  I personally viewed and interpreted the cardiac monitored which showed an underlying rhythm of: Normal sinus rhythm  Reevaluation: After the interventions noted above, I reevaluated the patient and found that they have :improved  Social Determinants of Health: Lives independently  Disposition: Discharge  Co morbidities that complicate the patient evaluation  Past Medical History:  Diagnosis Date   Allergy    mild    Celiac sprue    Resolved at age 29-13   Hyperlipidemia    Hypertension    Lactose intolerance    Mitral valve prolapse    Pyloric stenosis    Spontaneous pneumothorax    unsure side     Medicines Meds ordered this encounter  Medications   iohexol (OMNIPAQUE) 300 MG/ML solution 100 mL   amoxicillin-clavulanate (AUGMENTIN) 875-125 MG per tablet 1 tablet   amoxicillin-clavulanate (AUGMENTIN) 875-125 MG tablet    Sig: Take 1 tablet by mouth every 12 (twelve) hours.    Dispense:  20 tablet    Refill:  0   oxyCODONE-acetaminophen (PERCOCET/ROXICET) 5-325 MG tablet    Sig: Take 1 tablet by mouth every 6 (six) hours as needed for severe pain.    Dispense:  10 tablet    Refill:  0    I have reviewed the patients home medicines and have made adjustments as needed  Problem List / ED Course: Problem List Items Addressed This Visit   None Visit Diagnoses     Diverticulitis of colon    -  Primary   Relevant Medications   amoxicillin-clavulanate (AUGMENTIN) 875-125 MG per tablet 1 tablet   amoxicillin-clavulanate (AUGMENTIN) 875-125 MG tablet   oxyCODONE-acetaminophen (PERCOCET/ROXICET) 5-325 MG tablet                    Final Clinical Impression(s) / ED Diagnoses Final diagnoses:  Diverticulitis of colon    Rx / DC Orders ED Discharge Orders          Ordered    amoxicillin-clavulanate (AUGMENTIN) 875-125 MG tablet  Every 12 hours        05/01/22 0510    oxyCODONE-acetaminophen (PERCOCET/ROXICET) 5-325 MG tablet  Every 6 hours PRN        05/01/22 0510              Shon Baton, MD 05/01/22 [phone]

## 2022-06-15 ENCOUNTER — Ambulatory Visit: Payer: 59 | Admitting: Student

## 2022-06-19 NOTE — Progress Notes (Unsigned)
Synopsis: Referred for dyspnea by Chilton Greathouse, MD  Subjective:   PATIENT ID: Harry Molina GENDER: male DOB: [birth year], MRN: [id number]  No chief complaint on file.   51yM with history of spontaneous PTX 24 years ago, HTN  Regarding his spontaneous PTX, he does say he played football at club level in college but doesn't recall any specific traumatic event. He doesn't remember how he was treated but he doesn't think that he had chest tube. He remembers having subcutaneous emphysema. He was in hospital for 3-4 days.   He says he has shortness of breath which is not necessarily exertional. For instance exercised last week and felt totally normal. Talking can precipitate dyspnea though. Some associated chest achiness. He thinks he was feeling this way even a little bit before covid-19. He has no cough. No throat tightness or hoarseness when it happens.   Has had 3 vaccinations for covid-19.  Did do course of paxlovid for covid-19 earlier this year and was not hospitalized.  No family history of lung disease including pneumothorax. Does have early CAD in family.   Owns a Physiological scientist. No concerning exposures. Has a dog, cat. Has lived in [place] as a child but otherwise in [place]. Travels frequently but not this year.   Interval HPI:  Last seen by me 05/10/21 at which point begain working up episodic dyspnea with TSH, bnp, CT Chest - all unremarkable. PFTs normal.  Still gets intermittent episodes of dyspnea which aren't exertional.   Has trouble staying awake during the day, snores, and wife observes occasional episodes of apnea.  ------------------ At last visit ordered methacholine challenge, PSG with AHI 9 overall though 32 while supine and 4 while side sleeping 08/10/21.     Otherwise pertinent review of systems is negative.    Past Medical History:  Diagnosis Date   Allergy    mild    Celiac sprue    Resolved at age 51-13   Hyperlipidemia    Hypertension     Lactose intolerance    Mitral valve prolapse    Pyloric stenosis    Spontaneous pneumothorax    unsure side     Family History  Problem Relation Age of Onset   Heart disease Father    Hyperlipidemia Father    Heart attack Father    Skin cancer Mother    Esophageal cancer Maternal Grandfather    Colon cancer Neg Hx    Stomach cancer Neg Hx    Pancreatic cancer Neg Hx    Liver disease Neg Hx    Colon polyps Neg Hx    Rectal cancer Neg Hx    Inflammatory bowel disease Neg Hx      Past Surgical History:  Procedure Laterality Date   BIOPSY STOMACH     LEFT HEART CATH AND CORONARY ANGIOGRAPHY N/A 09/21/2020   Procedure: LEFT HEART CATH AND CORONARY ANGIOGRAPHY;  Surgeon: Lyn Records, MD;  Location: [facility];  Service: Cardiovascular;  Laterality: N/A;   OPEN REDUCTION INTERNAL FIXATION (ORIF) PROXIMAL PHALANX Right 09/04/2015   Procedure: OPEN REDUCTION INTERNAL FIXATION (ORIF) RIGHT SMALL FINGER PROXIMAL PHALANX;  Surgeon: Dominica Severin, MD;  Location: [facility] OR;  Service: Orthopedics;  Laterality: Right;   pyloric stenosis     UPPER GASTROINTESTINAL ENDOSCOPY     as a child - small bowel bx for celiac     Social History   Socioeconomic History   Marital status: Married    Spouse name: Not on  file   Number of children: 3   Years of education: College   Highest education level: Not on file  Occupational History   Occupation: [employer]   Tobacco Use   Smoking status: Never   Smokeless tobacco: Current    Types: Snuff   Tobacco comments:    states he doesn't use smokeless tobacco "regularly"  Vaping Use   Vaping Use: Never used  Substance and Sexual Activity   Alcohol use: Yes    Alcohol/week: 0.0 standard drinks of alcohol    Comment: social   Drug use: No   Sexual activity: Not Currently  Other Topics Concern   Not on file  Social History Narrative   Drinks about 3-4 diet cokes a day    Social Determinants of Health   Financial Resource Strain:  Not on file  Food Insecurity: Not on file  Transportation Needs: Not on file  Physical Activity: Not on file  Stress: Not on file  Social Connections: Not on file  Intimate Partner Violence: Not on file     No Known Allergies   Outpatient Medications Prior to Visit  Medication Sig Dispense Refill   albuterol (VENTOLIN HFA) 108 (90 Base) MCG/ACT inhaler SMARTSIG:1-2 Puff(s) Via Inhaler Every 4 Hours PRN     amoxicillin-clavulanate (AUGMENTIN) 875-125 MG tablet Take 1 tablet by mouth every 12 (twelve) hours. 20 tablet 0   aspirin EC 81 MG tablet Take 81 mg by mouth daily.     loratadine (CLARITIN) 10 MG tablet Take 10 mg by mouth daily.     Multiple Vitamin tablet Take 1 tablet by mouth daily.     olmesartan (BENICAR) 40 MG tablet Take 20 mg by mouth daily.      oxyCODONE-acetaminophen (PERCOCET/ROXICET) 5-325 MG tablet Take 1 tablet by mouth every 6 (six) hours as needed for severe pain. 10 tablet 0   rosuvastatin (CRESTOR) 20 MG tablet Take 20 mg by mouth daily.     No facility-administered medications prior to visit.       Objective:   Physical Exam:  General appearance: 51 y.o., male, NAD, conversant  Eyes: anicteric sclerae; PERRL, tracking appropriately HENT: NCAT; MMM Neck: Trachea midline; no lymphadenopathy, no JVD Lungs: CTAB, no crackles, no wheeze, with normal respiratory effort CV: RRR, no murmur  Abdomen: Soft, non-tender; non-distended, BS present  Extremities: No peripheral edema, warm Skin: Normal turgor and texture; no rash Psych: Appropriate affect Neuro: Alert and oriented to person and place, no focal deficit     There were no vitals filed for this visit.     on RA BMI Readings from Last 3 Encounters:  05/01/22 26.45 kg/m  08/10/21 25.99 kg/m  06/27/21 27.53 kg/m   Wt Readings from Last 3 Encounters:  05/01/22 195 lb (88.5 kg)  08/10/21 197 lb (89.4 kg)  06/27/21 203 lb (92.1 kg)     CBC    Component Value Date/Time   WBC 7.0  05/01/2022 0130   RBC 4.32 05/01/2022 0130   HGB 13.7 05/01/2022 0130   HGB 13.8 09/16/2020 1120   HCT 40.2 05/01/2022 0130   HCT 42.3 09/16/2020 1120   PLT 216 05/01/2022 0130   PLT 232 09/16/2020 1120   MCV 93.1 05/01/2022 0130   MCV 94 09/16/2020 1120   MCH 31.7 05/01/2022 0130   MCHC 34.1 05/01/2022 0130   RDW 11.7 05/01/2022 0130   RDW 12.0 09/16/2020 1120      Chest Imaging: CT Coronaries 08/2019 reviewed by me and  unremarkable lung bases  CT Chest 05/30/21 reviewed by me with only 31mm LLL nodule, otherwise unremarkable  Pulmonary Functions Testing Results:    Latest Ref Rng & Units 06/27/2021    3:04 PM  PFT Results  FVC-Pre L 4.94   FVC-Predicted Pre % 90   FVC-Post L 4.92   FVC-Predicted Post % 90   Pre FEV1/FVC % % 85   Post FEV1/FCV % % 87   FEV1-Pre L 4.20   FEV1-Predicted Pre % 99   FEV1-Post L 4.28   DLCO uncorrected ml/min/mmHg 37.09   DLCO UNC% % 118   DLCO corrected ml/min/mmHg 37.09   DLCO COR %Predicted % 118   DLVA Predicted % 122   TLC L 6.47   TLC % Predicted % 87   RV % Predicted % 59   Reviewed by me, normal PFT  HSAT 08/2015 AHI 1  Echocardiogram:   TTE 06/2019: Trivial PCE  STress 08/2020: Conclusion(s)/Recommendation(s): Abnormal echo stress test. There appears  to be hypokinesis with a hinge point in the mid to distal septal and  inferior walls. Overall EF improves with stress except for this small  portion. Hypertensive response to exercise  Heart Catheterization 08/2020:   Normal coronary arteries. Right dominant coronary anatomy. Normal left ventricular function.  EF 55%.  Normal left ventricular end-diastolic pressure.   RECOMMENDATIONS:   Risk factor modification and long-term surveillance/management given the patient's young age.     Assessment & Plan:   # Dyspnea: Episodic. Unclear etiology. He had clean cath. Symptoms don't sound overall suspicious for asthma and would expect more consistent dyspnea on exertion  if there was alternative underlying parenchymal issue and PFTs normal. His TTE did have trivial PCE but his chest tightness/dyspnea doesn't really have positional component suggestive of pericarditis. Given absence of clear relationship to exertion I also question the utility of exercise testing.  # Excessive daytime sleepiness # Snoring  Plan: - methacholine challenge - in-lab PSG      Omar Person, MD  Pulmonary Critical Care 06/19/2022 3:08 PM

## 2022-06-21 ENCOUNTER — Ambulatory Visit (INDEPENDENT_AMBULATORY_CARE_PROVIDER_SITE_OTHER): Payer: 59 | Admitting: Student

## 2022-06-21 ENCOUNTER — Encounter: Payer: Self-pay | Admitting: Student

## 2022-06-21 VITALS — BP 112/60 | HR 80 | Ht 72.0 in | Wt 203.0 lb

## 2022-06-21 DIAGNOSIS — G4733 Obstructive sleep apnea (adult) (pediatric): Secondary | ICD-10-CM

## 2022-06-21 NOTE — Patient Instructions (Addendum)
-   We will prescribe autopap and I'll see you in a few months to discuss  - Let your DME supplier know if you hate the mask choice you made and want to try another

## 2022-07-19 ENCOUNTER — Telehealth: Payer: Self-pay | Admitting: Student

## 2022-07-19 NOTE — Telephone Encounter (Signed)
Called and spoke to patient to give him an update that I was re faxing the cpap order and sleep study to [facility]. He states he understood. Nothing further needed

## 2022-10-19 ENCOUNTER — Encounter: Payer: Self-pay | Admitting: Cardiovascular Disease

## 2022-10-19 ENCOUNTER — Ambulatory Visit: Payer: 59 | Attending: Cardiovascular Disease | Admitting: Cardiovascular Disease

## 2022-10-19 VITALS — BP 142/78 | HR 85 | Ht 72.0 in | Wt 200.0 lb

## 2022-10-19 DIAGNOSIS — I10 Essential (primary) hypertension: Secondary | ICD-10-CM | POA: Diagnosis not present

## 2022-10-19 DIAGNOSIS — I351 Nonrheumatic aortic (valve) insufficiency: Secondary | ICD-10-CM

## 2022-10-19 DIAGNOSIS — E782 Mixed hyperlipidemia: Secondary | ICD-10-CM | POA: Diagnosis not present

## 2022-10-19 NOTE — Patient Instructions (Addendum)
Medication Instructions:  Your physician recommends that you continue on your current medications as directed. Please refer to the Current Medication list given to you today.  *If you need a refill on your cardiac medications before your next appointment, please call your pharmacy*   Lab Work: NONE If you have labs (blood work) drawn today and your tests are completely normal, you will receive your results only by: [place] (if you have MyChart) OR A paper copy in the mail If you have any lab test that is abnormal or we need to change your treatment, we will call you to review the results.   Testing/Procedures: ECHO Your physician has requested that you have an echocardiogram. Echocardiography is a painless test that uses sound waves to create images of your heart. It provides your doctor with information about the size and shape of your heart and how well your heart's chambers and valves are working. This procedure takes approximately one hour. There are no restrictions for this procedure. Please do NOT wear cologne, perfume, aftershave, or lotions (deodorant is allowed). Please arrive 15 minutes prior to your appointment time.    Follow-Up: At [facility], you and your health needs are our priority.  As part of our continuing mission to provide you with exceptional heart care, we have created designated Provider Care Teams.  These Care Teams include your primary Cardiologist (physician) and Advanced Practice Providers (APPs -  Physician Assistants and Nurse Practitioners) who all work together to provide you with the care you need, when you need it.  We recommend signing up for the patient portal called "MyChart".  Sign up information is provided on this After Visit Summary.  MyChart is used to connect with patients for Virtual Visits (Telemedicine).  Patients are able to view lab/test results, encounter notes, upcoming appointments, etc.  Non-urgent messages can be sent  to your provider as well.   To learn more about what you can do with MyChart, go to [email or web address].    Your next appointment:   1 year(s)  Provider:   Sherren Mocha, MD

## 2022-10-19 NOTE — Progress Notes (Signed)
Cardiology Office Note:    Date:  10/19/2022   ID:  Harry Molina, DOB 1971/03/20, MRN [id number]  PCP:  Prince Solian, [place] [facility] Cardiologist:  Sherren Mocha, MD     Referring MD: Prince Solian, MD   Chief Complaint  Patient presents with   Follow-up    Chest pain    History of Present Illness:    Harry Molina is a 52 y.o. male with a hx of chest pain, presenting for follow-up evaluation.  The patient is followed here for several years.  He is here alone today.  He has a history of hypertension, mixed hyperlipidemia, and strong family history of coronary artery disease.  His father had his first MI in his 62s.  He had a first cousin who died suddenly at age 48.  The patient has a history of coronary calcium scan with a coronary calcium score of 0.  He had an abnormal exercise stress echocardiogram in 2022.  This was further evaluated with a heart catheterization demonstrating angiographically normal coronary arteries.  The patient is doing fairly well.  He is here alone today for follow-up evaluation.  He has occasional shortness of breath and chest discomfort, but nothing consistent.  Some days he is winded with low levels of exercise and other days he can ride an exercise bike for an hour without any problem.  No edema, orthopnea, or PND.  Tolerating telmisartan and rosuvastatin now for many years without significant side effects.  Past Medical History:  Diagnosis Date   Allergy    mild    Celiac sprue    Resolved at age 56-13   Hyperlipidemia    Hypertension    Lactose intolerance    Mitral valve prolapse    Pyloric stenosis    Spontaneous pneumothorax    unsure side    Past Surgical History:  Procedure Laterality Date   BIOPSY STOMACH     LEFT HEART CATH AND CORONARY ANGIOGRAPHY N/A 09/21/2020   Procedure: LEFT HEART CATH AND CORONARY ANGIOGRAPHY;  Surgeon: Belva Crome, MD;  Location: [facility];  Service:  Cardiovascular;  Laterality: N/A;   OPEN REDUCTION INTERNAL FIXATION (ORIF) PROXIMAL PHALANX Right 09/04/2015   Procedure: OPEN REDUCTION INTERNAL FIXATION (ORIF) RIGHT SMALL FINGER PROXIMAL PHALANX;  Surgeon: Roseanne Kaufman, MD;  Location: [facility];  Service: Orthopedics;  Laterality: Right;   pyloric stenosis     UPPER GASTROINTESTINAL ENDOSCOPY     as a child - small bowel bx for celiac     Current Medications: Current Meds  Medication Sig   loratadine (CLARITIN) 10 MG tablet Take 10 mg by mouth daily as needed.   Multiple Vitamin tablet Take 1 tablet by mouth daily.   olmesartan (BENICAR) 40 MG tablet Take 20 mg by mouth daily.    rosuvastatin (CRESTOR) 20 MG tablet Take 20 mg by mouth daily.     Allergies:   Patient has no known allergies.   Social History   Socioeconomic History   Marital status: Married    Spouse name: Not on file   Number of children: 3   Years of education: College   Highest education level: Not on file  Occupational History   Occupation: [employer]   Tobacco Use   Smoking status: Never   Smokeless tobacco: Current    Types: Snuff   Tobacco comments:    states he doesn't use smokeless tobacco "regularly"  Vaping Use   Vaping Use:  Never used  Substance and Sexual Activity   Alcohol use: Yes    Alcohol/week: 0.0 standard drinks of alcohol    Comment: social   Drug use: No   Sexual activity: Not Currently  Other Topics Concern   Not on file  Social History Narrative   Drinks about 3-4 diet cokes a day    Social Determinants of Health   Financial Resource Strain: Not on file  Food Insecurity: Not on file  Transportation Needs: Not on file  Physical Activity: Not on file  Stress: Not on file  Social Connections: Not on file     Family History: The patient's family history includes Esophageal cancer in his maternal grandfather; Heart attack in his father; Heart disease in his father; Hyperlipidemia in his father; Skin cancer in his mother.  There is no history of Colon cancer, Stomach cancer, Pancreatic cancer, Liver disease, Colon polyps, Rectal cancer, or Inflammatory bowel disease.  ROS:   Please see the history of present illness.    All other systems reviewed and are negative.  EKGs/Labs/Other Studies Reviewed:    The following studies were reviewed today: Cardiac Studies & Procedures   CARDIAC CATHETERIZATION  CARDIAC CATHETERIZATION 09/21/2020  Narrative  Normal coronary arteries.  Right dominant coronary anatomy.  Normal left ventricular function.  EF 55%.  Normal left ventricular end-diastolic pressure.  RECOMMENDATIONS:   Risk factor modification and long-term surveillance/management given the patient's young age.  Findings Coronary Findings Diagnostic  Dominance: Right  No diagnostic findings have been documented. Intervention  No interventions have been documented.   STRESS TESTS  ECHOCARDIOGRAM STRESS TEST 10/18/2020  Narrative EXERCISE STRESS ECHO REPORT   --------------------------------------------------------------------------------  Patient Name:   Harry Molina Date of Exam: 09/15/2020 Medical Rec #:  [id number]           Height:       73.0 in Accession #:    [id number]          Weight:       190.0 lb Date of Birth:  10/28/1970           BSA:          2.105 m Patient Age:    45 years            BP:           149/60 mmHg Patient Gender: M                   HR:           76 bpm. Exam Location:  [facility]  Procedure: Cardiac Doppler, Limited Echo, Limited Color Doppler and Stress Echo  Indications:    R07.9 Chest pain  History:        Patient has prior history of Echocardiogram examinations, most recent 06/23/2019.  Sonographer:    Coralyn Helling RDCS Referring Phys: [place] Comments: DPR SIGNED FOR [facility] ON 06-22-20. OK TO SPEAK WITH LAURA O'CONNELL (SPOUSE)\\_DPR ON FILE FOR LAURA Jr IMPRESSIONS   1. This is a positive stress  echocardiogram for ischemia (see scoring diagram/findings for description). 2. This is an intermediate risk study.  Conclusion(s)/Recommendation(s): Abnormal echo stress test. There appears to be hypokinesis with a hinge point in the mid to distal septal and inferior walls. Overall EF improves with stress except for this small portion.  FINDINGS  Exam Protocol: The patient exercised on a treadmill according to a Bruce protocol.   Patient Performance: The patient  exercised for 10 minutes and 50 seconds, achieving 13 METS. The maximum stage achieved was IV of the Bruce protocol. The baseline heart rate was 88 bpm. The heart rate at peak stress was 179 bpm. The target heart rate was calculated to be 145 bpm. The percentage of maximum predicted heart rate achieved was 104.8 %. The baseline blood pressure was 149/90 mmHg. The blood pressure at peak stress was 212/57 mmHg. The blood pressure response was hypertensive. The patient developed fatigue during the stress exam. The symptoms resolved with rest. The patient's functional capacity was above average.  EKG: Resting EKG showed normal sinus rhythm with no abnormal findings. The patient developed occasional PAC during exercise. There was 1.0 mm of horizontal ST segment depression in lead(s) III.   2D Echo Findings: The baseline ejection fraction was 60%. The peak ejection fraction at stress was 80%. Baseline regional wall motion abnormalities were not present. This is a positive stress echocardiogram for ischemia.   LV Wall Scoring: Peak Stress The apical septal segment is akinetic. The entire inferior wall, mid anteroseptal segment, and mid inferoseptal segment are hypokinetic. The entire anterior wall, entire lateral wall, basal anteroseptal segment, basal inferoseptal segment, and apex are normal.    Buford Dresser MD Electronically signed on 09/15/2020 at 4:29:07 PM     Final   ECHOCARDIOGRAM  ECHOCARDIOGRAM COMPLETE  06/23/2019  Narrative ECHOCARDIOGRAM REPORT    Patient Name:   Harry Molina Date of Exam: 06/23/2019 Medical Rec #:  [id number]           Height:       73.0 in Accession #:    [id number]          Weight:       191.0 lb Date of Birth:  1971/02/23           BSA:          2.11 m Patient Age:    23 years            BP:           140/80 mmHg Patient Gender: M                   HR:           82 bpm. Exam Location:  [facility]  Procedure: 2D Echo, Cardiac Doppler and Color Doppler  Indications:    I35.1  History:        Patient has prior history of Echocardiogram examinations, most recent 06/27/2017. AI; Risk Factors:Hypertension and Dyslipidemia.  Sonographer:    Coralyn Helling RDCS Referring Phys: [place]   1. Left ventricular ejection fraction, by visual estimation, is 55 to 60%. The left ventricle has normal function. There is no left ventricular hypertrophy. 2. Global right ventricle has normal systolic function.The right ventricular size is normal. No increase in right ventricular wall thickness. 3. Left atrial size was normal. 4. Right atrial size was normal. 5. Trivial pericardial effusion is present. 6. The mitral valve is normal in structure. Trace mitral valve regurgitation. No evidence of mitral stenosis. 7. The tricuspid valve is normal in structure. Tricuspid valve regurgitation is trivial. 8. The aortic valve is tricuspid. Aortic valve regurgitation is mild. No evidence of aortic valve sclerosis or stenosis. 9. The pulmonic valve was normal in structure. Pulmonic valve regurgitation is not visualized. 10. Normal pulmonary artery systolic pressure. 11. The inferior vena cava is normal in size with greater than 50% respiratory variability, suggesting  right atrial pressure of 3 mmHg.  FINDINGS Left Ventricle: Left ventricular ejection fraction, by visual estimation, is 55 to 60%. The left ventricle has normal function. There is no left  ventricular hypertrophy. Left ventricular diastolic parameters were normal. Normal left atrial pressure.  Right Ventricle: The right ventricular size is normal. No increase in right ventricular wall thickness. Global RV systolic function is has normal systolic function. The tricuspid regurgitant velocity is 2.04 m/s, and with an assumed right atrial pressure of 3 mmHg, the estimated right ventricular systolic pressure is normal at 19.6 mmHg.  Left Atrium: Left atrial size was normal in size.  Right Atrium: Right atrial size was normal in size  Pericardium: Trivial pericardial effusion is present.  Mitral Valve: The mitral valve is normal in structure. No evidence of mitral valve stenosis by observation. Trace mitral valve regurgitation.  Tricuspid Valve: The tricuspid valve is normal in structure. Tricuspid valve regurgitation is trivial.  Aortic Valve: The aortic valve is tricuspid. Aortic valve regurgitation is mild. Aortic regurgitation PHT measures 446 msec. The aortic valve is structurally normal, with no evidence of sclerosis or stenosis.  Pulmonic Valve: The pulmonic valve was normal in structure. Pulmonic valve regurgitation is not visualized.  Aorta: The aortic root, ascending aorta and aortic arch are all structurally normal, with no evidence of dilitation or obstruction.  Venous: The inferior vena cava is normal in size with greater than 50% respiratory variability, suggesting right atrial pressure of 3 mmHg.  IAS/Shunts: No atrial level shunt detected by color flow Doppler. No ventricular septal defect is seen or detected. There is no evidence of an atrial septal defect.    LEFT VENTRICLE PLAX 2D LVIDd:         4.60 cm  Diastology LVIDs:         2.90 cm  LV e' lateral:   13.80 cm/s LV PW:         0.90 cm  LV E/e' lateral: 6.1 LV IVS:        1.20 cm  LV e' medial:    9.57 cm/s LVOT diam:     2.00 cm  LV E/e' medial:  8.8 LV SV:         65 ml LV SV Index:   30.73 LVOT  Area:     3.14 cm   RIGHT VENTRICLE             IVC RV S prime:     10.70 cm/s  IVC diam: 1.40 cm TAPSE (M-mode): 2.0 cm RVSP:           19.6 mmHg  LEFT ATRIUM             Index       RIGHT ATRIUM           Index LA diam:        3.40 cm 1.61 cm/m  RA Pressure: 3.00 mmHg LA Vol (A2C):   39.5 ml 18.72 ml/m RA Area:     15.40 cm LA Vol (A4C):   28.7 ml 13.60 ml/m RA Volume:   42.00 ml  19.91 ml/m LA Biplane Vol: 34.3 ml 16.26 ml/m AORTIC VALVE LVOT Vmax:   115.00 cm/s LVOT Vmean:  68.200 cm/s LVOT VTI:    0.202 m AI PHT:      446 msec  AORTA Ao Root diam: 3.20 cm Ao Asc diam:  2.60 cm  MITRAL VALVE  TRICUSPID VALVE MV Area (PHT):                      TR Peak grad:   16.6 mmHg TR Vmax:        204.00 cm/s MV Decel Time: 240 msec             Estimated RAP:  3.00 mmHg MV E velocity: 84.40 cm/s 103 cm/s  RVSP:           19.6 mmHg MV A velocity: 62.60 cm/s 70.3 cm/s MV E/A ratio:  1.35       1.5       SHUNTS Systemic VTI:  0.20 m Systemic Diam: 2.00 cm   Skeet Latch MD Electronically signed by Skeet Latch MD Signature Date/Time: 06/23/2019/3:03:43 PM    Final     CT SCANS  CT CORONARY MORPH W/CTA COR W/SCORE 08/17/2019  Addendum 08/17/2019 11:35 AM ADDENDUM REPORT: 08/17/2019 11:32  CLINICAL DATA:  57M with significant family history of CAD presents with chest pain  EXAM: Cardiac/Coronary CTA  TECHNIQUE: The patient was scanned on a [employer].  FINDINGS: A 100 kV prospective scan was triggered in the descending thoracic aorta at 111 HU's. Axial non-contrast 3 mm slices were carried out through the heart. The data set was analyzed on a dedicated work station and scored using the [place]. Gantry rotation speed was 250 msecs and collimation was .6 mm. 0.8 mg of sl NTG was given. The 3D data set was reconstructed in 5% intervals of the 67-82 % of the R-R cycle. Diastolic phases were analyzed on a dedicated  work station using MPR, MIP and VRT modes. The patient received 80 cc of contrast.  Aorta: Normal size.  No calcifications.  No dissection.  Aortic Valve:  Trileaflet.  No calcifications.  Coronary Arteries:  Normal coronary origin.  Right dominance.  RCA is a large dominant artery that gives rise to PDA and PLA. There is no plaque.  Left main is a large artery that gives rise to LAD and LCX arteries.  LAD is a large vessel that has no plaque. There is a large D1 that has no plaque.  LCX is a non-dominant artery that gives rise to one large OM1 branch. There is no plaque.  There is a small ramus.  Other findings:  Left Ventricle: Normal size  Left Atrium: Mild enlargement  Pulmonary Veins: Normal configuration  Right Ventricle: Mild dilatation  Right Atrium: Mild enlargement  Cardiac valves: No calcifications  Thoracic aorta: Normal size  Pulmonary Arteries: Normal size  Systemic Veins: Normal drainage  Pericardium: Normal thickness  IMPRESSION: 1. Coronary calcium score of 0. This was 0 percentile for age and sex matched control.  2. Normal coronary origin with right dominance.  3. No evidence of CAD.  CAD-RADS 0. No evidence of CAD (0%). Consider non-atherosclerotic causes of chest pain.   Electronically Signed By: Oswaldo Milian MD On: 08/17/2019 11:32  Narrative EXAM: OVER-READ INTERPRETATION  CT CHEST  The following report is an over-read performed by radiologist Dr. Rolm Baptise of [facility] [place] on 08/15/2019. This over-read does not include interpretation of cardiac or coronary anatomy or pathology. The coronary CTA interpretation by the cardiologist is attached.  COMPARISON:  None.  FINDINGS: Vascular: Heart is normal size.  Visualized aorta normal caliber.  Mediastinum/Nodes: No adenopathy in the lower mediastinum or hila.  Lungs/Pleura: Visualized lungs clear.  No effusions.  Upper Abdomen: Imaging into the  upper abdomen shows no acute findings.  Musculoskeletal: Chest wall soft tissues are unremarkable. No acute bony abnormality.  IMPRESSION: No acute or significant extracardiac abnormality.  Electronically Signed: By: Rolm Baptise M.D. On: 08/15/2019 15:39   CT SCANS  CT CARDIAC SCORING (SELF PAY ONLY) 02/10/2016  Addendum 02/10/2016  3:45 PM ADDENDUM REPORT: 02/10/2016 15:42 EXAM: OVER-READ INTERPRETATION  CT CHEST The following report is an over-read performed by radiologist Dr. Rebekah [facility] on 02/10/2016. This over-read does not include interpretation of cardiac or coronary anatomy or pathology. The coronary calcium interpretation by the cardiologist is attached. COMPARISON:  None. FINDINGS: Within the visualized portions of the thorax there are no suspicious appearing pulmonary nodules or masses, there is no acute consolidative airspace disease, no pleural effusion, no pneumothorax and no lymphadenopathy. Visualized portions of the upper abdomen are unremarkable. There are no aggressive appearing lytic or blastic lesions noted in the visualized portions of the skeleton. IMPRESSION: 1. No significant incidental noncardiac findings are noted. Electronically Signed By: Vinnie Langton M.D. On: 02/10/2016 15:42  Narrative CLINICAL DATA:  Risk stratification  EXAM: Coronary Calcium Score  MEDICATIONS: None  TECHNIQUE: The patient was scanned on a Siemens Somatom 64 slice scanner. Axial non-contrast 3 mm slices were carried out through the heart. The data set was analyzed on a dedicated work station and scored using the [place].  FINDINGS: Non-cardiac: See separate report from [facility].  Ascending Aorta:  Normal size, no calcifications.  Pericardium: Normal.  Coronary arteries:  Normal origin.  IMPRESSION: Coronary calcium score of 0. This was 0 percentile for age and sex matched control.  Ena Dawley  Electronically Signed: By: Ena Dawley On: 02/10/2016 14:44           EKG:  EKG is ordered today.  The ekg ordered today demonstrates NSR 85 bpm, within normal limits  Recent Labs: 05/01/2022: ALT 34; BUN 19; Creatinine, Ser 1.08; Hemoglobin 13.7; Platelets 216; Potassium 4.1; Sodium 140  Recent Lipid Panel No results found for: "CHOL", "TRIG", "HDL", "CHOLHDL", "VLDL", "LDLCALC", "LDLDIRECT"   Risk Assessment/Calculations:           Physical Exam:    VS:  BP (!) 142/78 (BP Location: Right Arm, Patient Position: Sitting, Cuff Size: Normal)   Pulse 85   Ht 6' (1.829 m)   Wt 200 lb (90.7 kg)   SpO2 97%   BMI 27.12 kg/m     Wt Readings from Last 3 Encounters:  10/19/22 200 lb (90.7 kg)  06/21/22 203 lb (92.1 kg)  05/01/22 195 lb (88.5 kg)     GEN:  Well nourished, well developed in no acute distress HEENT: Normal NECK: No JVD; No carotid bruits LYMPHATICS: No lymphadenopathy CARDIAC: RRR, no murmurs, rubs, gallops RESPIRATORY:  Clear to auscultation without rales, wheezing or rhonchi  ABDOMEN: Soft, non-tender, non-distended MUSCULOSKELETAL:  No edema; No deformity  SKIN: Warm and dry NEUROLOGIC:  Alert and oriented x 3 PSYCHIATRIC:  Normal affect   ASSESSMENT:    1. Essential hypertension   2. Mixed hyperlipidemia   3. Nonrheumatic aortic valve insufficiency    PLAN:    In order of problems listed above:  Blood pressure typically well-controlled, slightly above goal today in the doctor's office.  Continue on losartan.  No changes made today. Treated with rosuvastatin.  Last lipids with a cholesterol 173, HDL 45, LDL 82. History of mild aortic valve insufficiency, last echocardiogram 2020.  Recommend updated 2D echocardiogram.  I do  not appreciate a murmur on his exam today.           Medication Adjustments/Labs and Tests Ordered: Current medicines are reviewed at length with the patient today.  Concerns regarding medicines are outlined  above.  Orders Placed This Encounter  Procedures   EKG 12-Lead   ECHOCARDIOGRAM COMPLETE   No orders of the defined types were placed in this encounter.   Patient Instructions  Medication Instructions:  Your physician recommends that you continue on your current medications as directed. Please refer to the Current Medication list given to you today.  *If you need a refill on your cardiac medications before your next appointment, please call your pharmacy*   Lab Work: NONE If you have labs (blood work) drawn today and your tests are completely normal, you will receive your results only by: [place] (if you have MyChart) OR A paper copy in the mail If you have any lab test that is abnormal or we need to change your treatment, we will call you to review the results.   Testing/Procedures: ECHO Your physician has requested that you have an echocardiogram. Echocardiography is a painless test that uses sound waves to create images of your heart. It provides your doctor with information about the size and shape of your heart and how well your heart's chambers and valves are working. This procedure takes approximately one hour. There are no restrictions for this procedure. Please do NOT wear cologne, perfume, aftershave, or lotions (deodorant is allowed). Please arrive 15 minutes prior to your appointment time.    Follow-Up: At [facility], you and your health needs are our priority.  As part of our continuing mission to provide you with exceptional heart care, we have created designated Provider Care Teams.  These Care Teams include your primary Cardiologist (physician) and Advanced Practice Providers (APPs -  Physician Assistants and Nurse Practitioners) who all work together to provide you with the care you need, when you need it.  We recommend signing up for the patient portal called "MyChart".  Sign up information is provided on this After Visit Summary.  MyChart is  used to connect with patients for Virtual Visits (Telemedicine).  Patients are able to view lab/test results, encounter notes, upcoming appointments, etc.  Non-urgent messages can be sent to your provider as well.   To learn more about what you can do with MyChart, go to [email or web address].    Your next appointment:   1 year(s)  Provider:   Sherren Mocha, MD        Signed, Sherren Mocha, MD  10/19/2022 7:26 PM    [place]

## 2022-11-21 ENCOUNTER — Other Ambulatory Visit (HOSPITAL_BASED_OUTPATIENT_CLINIC_OR_DEPARTMENT_OTHER): Payer: Self-pay

## 2022-11-21 ENCOUNTER — Ambulatory Visit (HOSPITAL_COMMUNITY): Payer: 59 | Attending: Cardiovascular Disease

## 2022-11-21 DIAGNOSIS — I10 Essential (primary) hypertension: Secondary | ICD-10-CM | POA: Diagnosis not present

## 2022-11-21 DIAGNOSIS — E782 Mixed hyperlipidemia: Secondary | ICD-10-CM | POA: Diagnosis present

## 2022-11-21 DIAGNOSIS — I351 Nonrheumatic aortic (valve) insufficiency: Secondary | ICD-10-CM

## 2022-11-21 LAB — ECHOCARDIOGRAM COMPLETE
Area-P 1/2: 3.36 cm2
P 1/2 time: 316 msec
S' Lateral: 2.4 cm

## 2023-08-13 ENCOUNTER — Other Ambulatory Visit: Payer: Self-pay | Admitting: Family Medicine

## 2023-08-13 DIAGNOSIS — R1030 Lower abdominal pain, unspecified: Secondary | ICD-10-CM

## 2023-08-14 ENCOUNTER — Ambulatory Visit
Admission: RE | Admit: 2023-08-14 | Discharge: 2023-08-14 | Disposition: A | Discharge status: Home / Self Care | Payer: 59 | Source: Ambulatory Visit | Attending: Family Medicine | Admitting: Family Medicine

## 2023-08-14 DIAGNOSIS — R1030 Lower abdominal pain, unspecified: Secondary | ICD-10-CM

## 2023-08-14 MED ORDER — IOPAMIDOL (ISOVUE-300) INJECTION 61%
100.0000 mL | Freq: Once | INTRAVENOUS | Status: AC | PRN
  Administered 2023-08-14: 100 mL via INTRAVENOUS

## 2023-08-24 ENCOUNTER — Encounter: Payer: Self-pay | Admitting: Physician Assistant

## 2023-09-20 IMAGING — CT CT CHEST W/O CM
2 of 4 series · 15 of 36 positions shown, 18 images · non-contrast
Comparison: 08/15/2019

CLINICAL DATA: Remote history of spontaneous pneumothorax.
Intermittent shortness of breath.

EXAM:
CT CHEST WITHOUT CONTRAST
TECHNIQUE: Multidetector CT imaging of the chest was performed following the
standard protocol without IV contrast.

[Series 2: thorax · axial · 0.71mm/px · z∈[-349,-71]mm · 12 of 165 slices shown, 15 images]
[im 13/165  mediastinal]
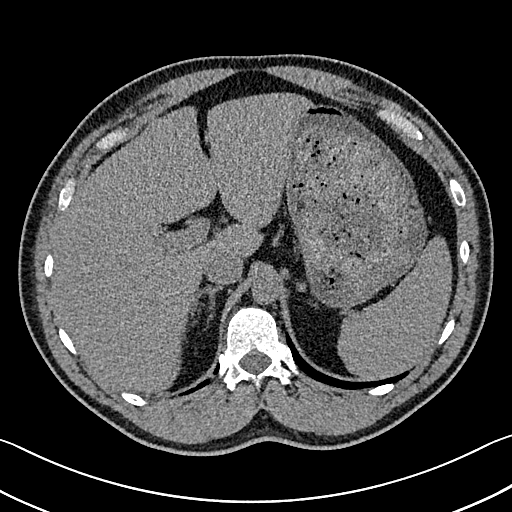
[im 13/165  lung]
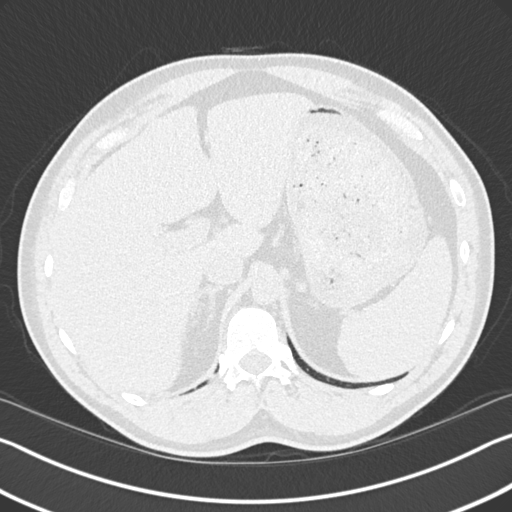
[im 26/165  lung]
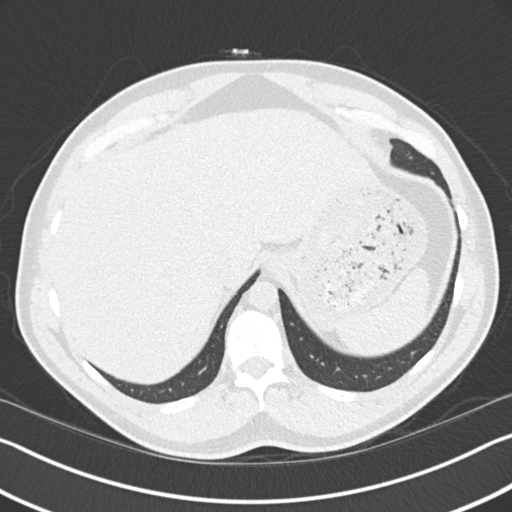
[im 38/165  lung]
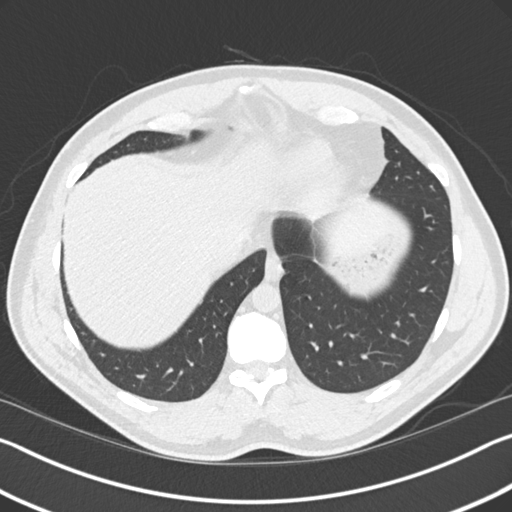
[im 51/165  lung]
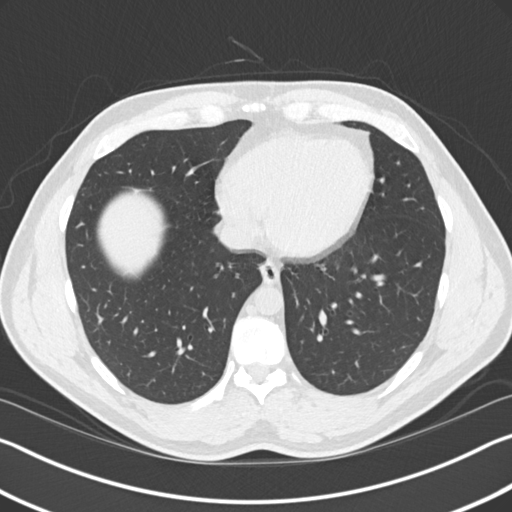
[im 64/165  mediastinal]
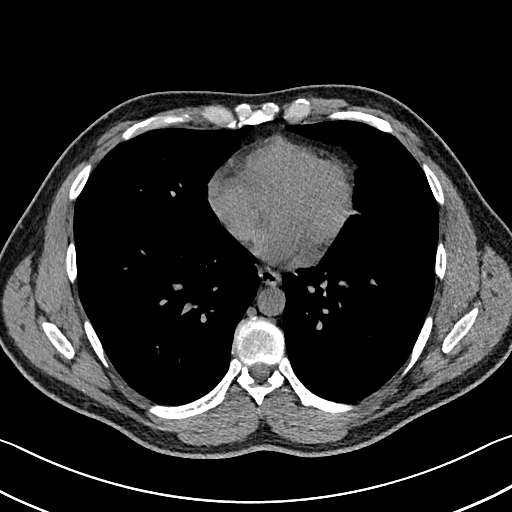
[im 64/165  lung]
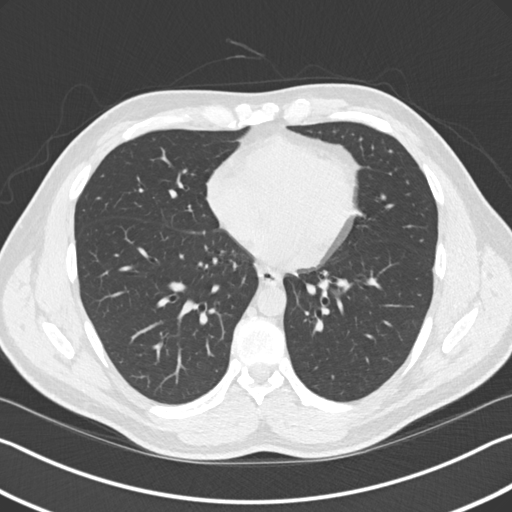
[im 76/165  lung]
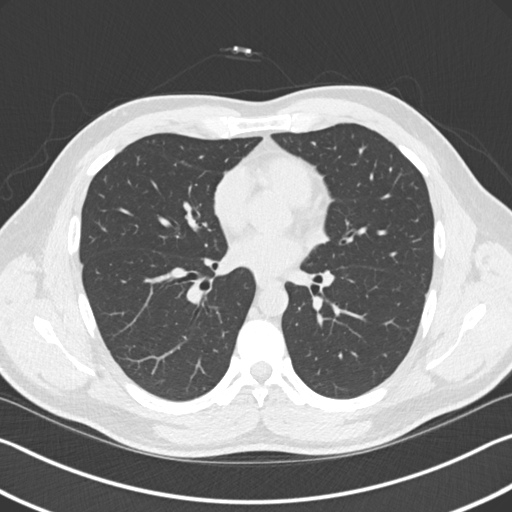
[im 89/165  lung]
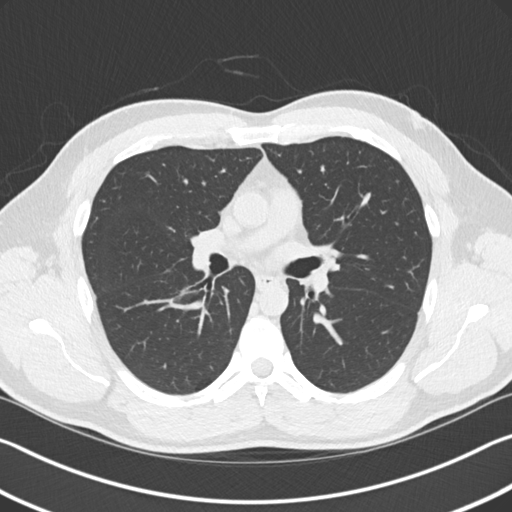
[im 101/165  lung]
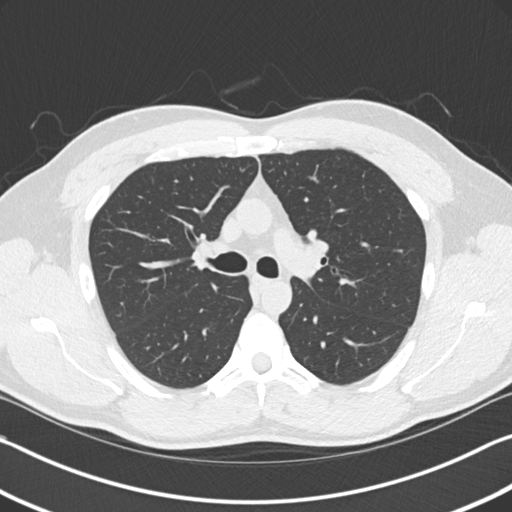
[im 114/165  mediastinal]
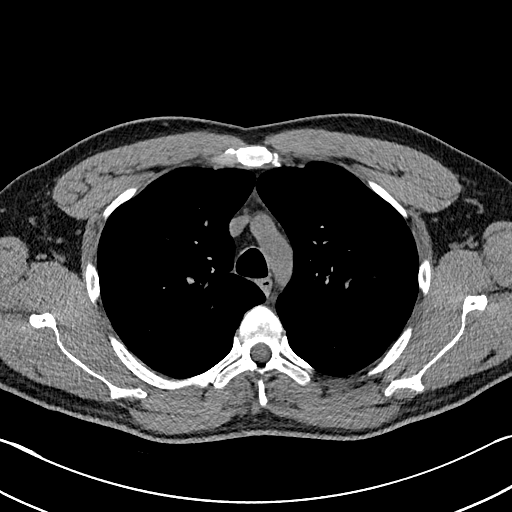
[im 114/165  lung]
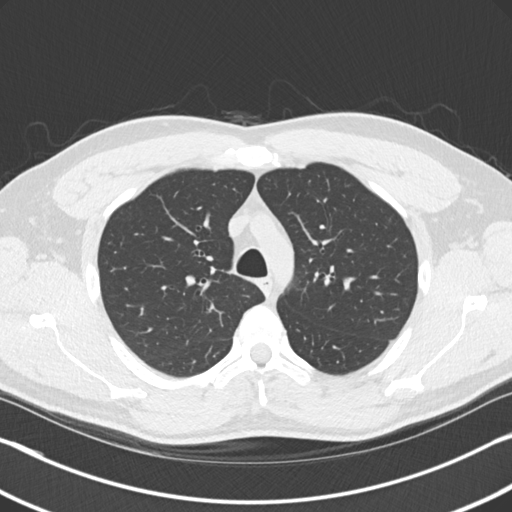
[im 127/165  lung]
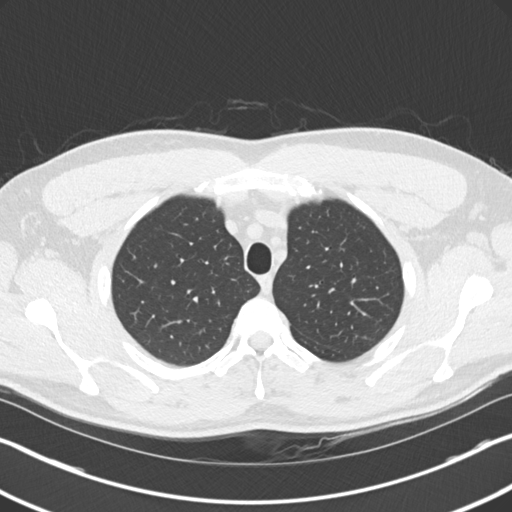
[im 139/165  lung]
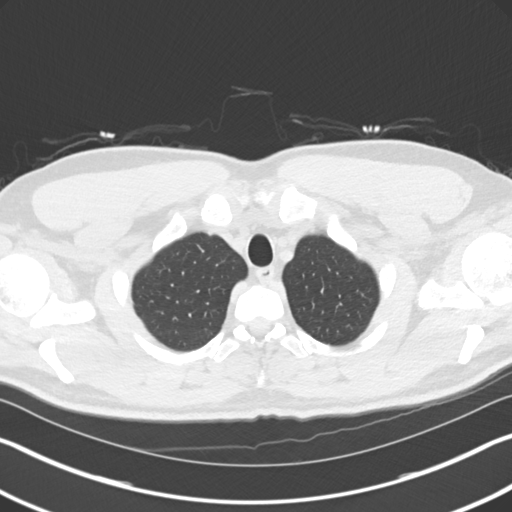
[im 152/165  lung]
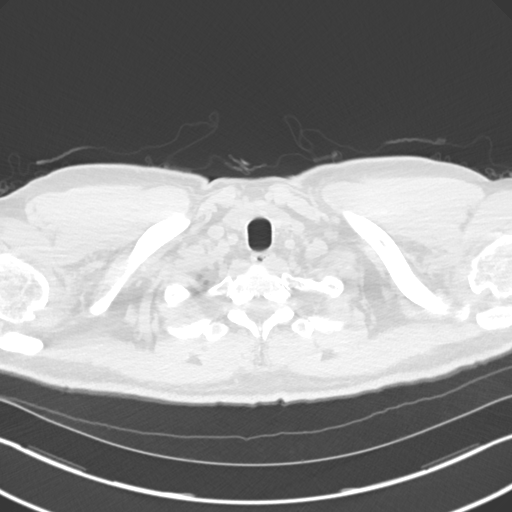

[Series 5: coronal · coronal · 0.64mm/px · 3 of 121 slices shown]
[im 25/121  lung]
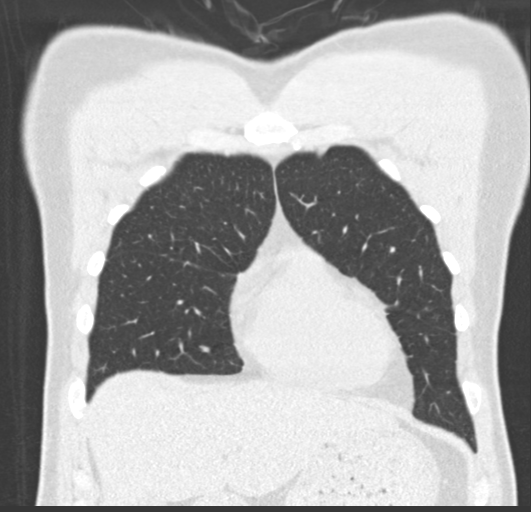
[im 49/121  lung]
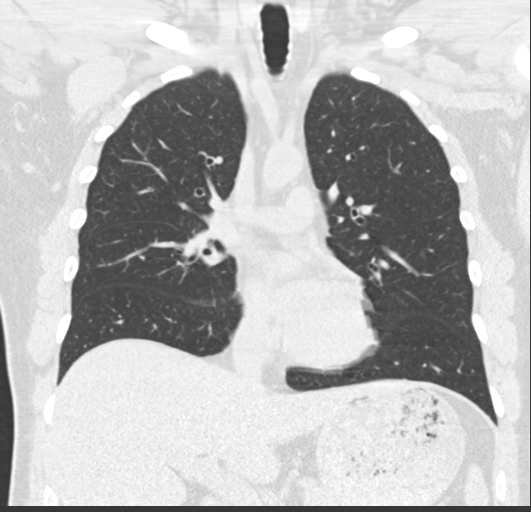
[im 73/121  lung]
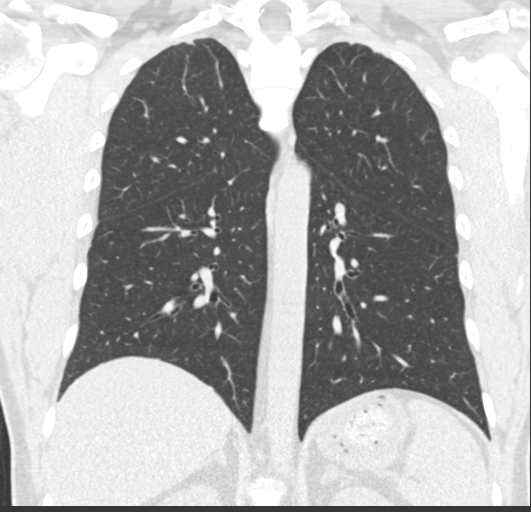

[15 of 36 positions shown; findings below may reference images not displayed]

FINDINGS: Cardiovascular: Heart is normal size. Aorta is normal caliber.

Mediastinum/Nodes: No mediastinal, hilar, or axillary adenopathy.
Trachea and esophagus are unremarkable. Thyroid unremarkable.

Lungs/Pleura: 3 mm nodule in the left lower lobe on image 49 of
series 3. This was not definitively seen on prior study. No other
nodules. No confluent opacities or effusions. No pneumothorax.

Upper Abdomen: Imaging into the upper abdomen demonstrates no acute
findings.

Musculoskeletal: Chest wall soft tissues are unremarkable. No acute
bony abnormality.
IMPRESSION: 3 mm left lower lobe pulmonary nodule. No follow-up needed if
patient is low-risk. Non-contrast chest CT can be considered in 12
months if patient is high-risk. This recommendation follows the
consensus statement: Guidelines for Management of Incidental
Pulmonary Nodules Detected on CT Images: From the [HOSPITAL]

## 2023-10-04 ENCOUNTER — Encounter: Payer: Self-pay | Admitting: Physician Assistant

## 2023-10-04 ENCOUNTER — Ambulatory Visit: Payer: 59 | Admitting: Physician Assistant

## 2023-10-04 VITALS — BP 142/80 | HR 67 | Ht 72.0 in | Wt 199.2 lb

## 2023-10-04 DIAGNOSIS — R933 Abnormal findings on diagnostic imaging of other parts of digestive tract: Secondary | ICD-10-CM

## 2023-10-04 DIAGNOSIS — Z8719 Personal history of other diseases of the digestive system: Secondary | ICD-10-CM | POA: Diagnosis not present

## 2023-10-04 MED ORDER — PLENVU 140 G PO SOLR: 1.0000 | Freq: Once | ORAL | 0 refills | Status: AC

## 2023-10-04 NOTE — Progress Notes (Signed)
 Chief Complaint: Abnormal CT of the abdomen  HPI:    [name] is a 53 year old male with a past medical history as listed below, known to Dr. Meridee Score, who was referred to me by Avva, Ravisankar, MD for a complaint of normal CT of the abdomen.    06/22/2020 patient seen in clinic by me for stomach issues since he was a young child.  He was scheduled for diagnostic EGD and colonoscopy given history of celiac disease.    12/16/2020 colonoscopy with hemorrhoids, normal mucosa in the entire colon, nonbleeding, nonthrombosed external and internal hemorrhoids.  Repeat recommended 10 years.    12/16/2020 EGD with your mucosa in the antrum and 2 cm hiatal hernia.  Biopsies negative for celiac disease.    05/01/2022 CT the abdomen pelvis for left lower quadrant pain with mild sigmoid diverticulitis.    08/14/2023 CT abdomen pelvis for lower abdominal pain with a 1.5 cm nodular density along the sigmoid colon which was present on the prior CT but slightly increased in size (previously measured approximately 8 mm).  This may represent a focus of scarring related to prior inflammatory process or an inflamed diverticula.  A colonic mass cannot be excluded.  Colonoscopy recommended.    Today, the patient presents to clinic and tells me at the time of the CT he had had some increased bloating and abdominal discomfort in his lower abdomen which is what spurred this exam.  Since then he has not had any further issues.  Reminds me that he has some chronic GI upset here and there.  He is just following up because he was told the CT was abnormal and he may need a colonoscopy.      Denies any change in bowel habits.  No nausea, vomiting, heartburn, reflux or blood in his stool.  Past Medical History:  Diagnosis Date   Allergy    mild    Celiac sprue    Resolved at age 36-13   Hyperlipidemia    Hypertension    Lactose intolerance    Mitral valve prolapse    Pyloric stenosis    Spontaneous pneumothorax     unsure side    Past Surgical History:  Procedure Laterality Date   BIOPSY STOMACH     LEFT HEART CATH AND CORONARY ANGIOGRAPHY N/A 09/21/2020   Procedure: LEFT HEART CATH AND CORONARY ANGIOGRAPHY;  Surgeon: Lyn Records, MD;  [facility]: [facility];  Service: Cardiovascular;  Laterality: N/A;   OPEN REDUCTION INTERNAL FIXATION (ORIF) PROXIMAL PHALANX Right 09/04/2015   Procedure: OPEN REDUCTION INTERNAL FIXATION (ORIF) RIGHT SMALL FINGER PROXIMAL PHALANX;  Surgeon: Dominica Severin, MD;  Location: [facility] OR;  Service: Orthopedics;  Laterality: Right;   pyloric stenosis     UPPER GASTROINTESTINAL ENDOSCOPY     as a child - small bowel bx for celiac     Current Outpatient Medications  Medication Sig Dispense Refill   loratadine (CLARITIN) 10 MG tablet Take 10 mg by mouth daily as needed.     Multiple Vitamin tablet Take 1 tablet by mouth daily.     olmesartan (BENICAR) 40 MG tablet Take 20 mg by mouth daily.      PEG-KCl-NaCl-NaSulf-Na Asc-C (PLENVU) 140 g SOLR Take 1 kit by mouth once for 1 dose. 3 each 0   rosuvastatin (CRESTOR) 20 MG tablet Take 20 mg by mouth daily.     No current facility-administered medications for this visit.    Allergies as of 10/04/2023   (No  Known Allergies)    Family History  Problem Relation Age of Onset   Heart disease Father    Hyperlipidemia Father    Heart attack Father    Skin cancer Mother    Esophageal cancer Maternal Grandfather    Colon cancer Neg Hx    Stomach cancer Neg Hx    Pancreatic cancer Neg Hx    Liver disease Neg Hx    Colon polyps Neg Hx    Rectal cancer Neg Hx    Inflammatory bowel disease Neg Hx     Social History   Socioeconomic History   Marital status: Married    Spouse name: Not on file   Number of children: 3   Years of education: College   Highest education level: Not on file  Occupational History   Occupation: [employer]   Tobacco Use   Smoking status: Never   Smokeless tobacco: Current    Types:  Snuff   Tobacco comments:    states he doesn't use smokeless tobacco "regularly"  Vaping Use   Vaping status: Never Used  Substance and Sexual Activity   Alcohol use: Yes    Alcohol/week: 0.0 standard drinks of alcohol    Comment: social   Drug use: No   Sexual activity: Not Currently  Other Topics Concern   Not on file  Social History Narrative   Drinks about 3-4 diet cokes a day    Social Drivers of Corporate investment banker Strain: Not on file  Food Insecurity: Not on file  Transportation Needs: Not on file  Physical Activity: Not on file  Stress: Not on file  Social Connections: Not on file  Intimate Partner Violence: Not on file    Review of Systems:    Constitutional: No weight loss, fever or chills  Cardiovascular: No chest pain, chest pressure or palpitations   Respiratory: No SOB or cough Gastrointestinal: See HPI and otherwise negative   Physical Exam:  Vital signs: BP (!) 142/80   Pulse 67   Ht 6' (1.829 m)   Wt 199 lb 4 oz (90.4 kg)   SpO2 99%   BMI 27.02 kg/m    Constitutional:   Pleasant Caucasian male appears to be in NAD, Well developed, Well nourished, alert and cooperative Respiratory: Respirations even and unlabored. Lungs clear to auscultation bilaterally.   No wheezes, crackles, or rhonchi.  Cardiovascular: Normal S1, S2. No MRG. Regular rate and rhythm. No peripheral edema, cyanosis or pallor.  Gastrointestinal:  Soft, nondistended, nontender. No rebound or guarding. Normal bowel sounds. No appreciable masses or hepatomegaly. Rectal:  Not performed.  Psychiatric:  Demonstrates good judgement and reason without abnormal affect or behaviors.  RELEVANT LABS AND IMAGING: CBC    Component Value Date/Time   WBC 7.0 05/01/2022 0130   RBC 4.32 05/01/2022 0130   HGB 13.7 05/01/2022 0130   HGB 13.8 09/16/2020 1120   HCT 40.2 05/01/2022 0130   HCT 42.3 09/16/2020 1120   PLT 216 05/01/2022 0130   PLT 232 09/16/2020 1120   MCV 93.1 05/01/2022  0130   MCV 94 09/16/2020 1120   MCH 31.7 05/01/2022 0130   MCHC 34.1 05/01/2022 0130   RDW 11.7 05/01/2022 0130   RDW 12.0 09/16/2020 1120    CMP     Component Value Date/Time   NA 140 05/01/2022 0130   NA 139 09/16/2020 1120   K 4.1 05/01/2022 0130   CL 104 05/01/2022 0130   CO2 26 05/01/2022 0130  GLUCOSE 105 (H) 05/01/2022 0130   BUN 19 05/01/2022 0130   BUN 16 09/16/2020 1120   CREATININE 1.08 05/01/2022 0130   CALCIUM 9.7 05/01/2022 0130   PROT 7.3 05/01/2022 0130   ALBUMIN 4.9 05/01/2022 0130   AST 35 05/01/2022 0130   ALT 34 05/01/2022 0130   ALKPHOS 42 05/01/2022 0130   BILITOT 0.6 05/01/2022 0130   GFRNONAA >60 05/01/2022 0130   GFRAA 96 09/16/2020 1120    Assessment: 1.  Abnormal CT of the abdomen: Showing a nodularity in the sigmoid colon which has increased in size from 8 mm to 1.5 cm over the past 2 years, last colonoscopy was in May 2022, first CT was in October 2023, colonoscopy with no abnormal findings in this area, does have history of diverticulitis; consider scarring from diverticulitis versus polyp versus other  Plan: 1.  Scheduled patient for diagnostic colonoscopy in the [facility] with Dr. Meridee Score.  Did provide the patient a detailed list of risks for the procedure and he agrees to proceed. Patient is appropriate for endoscopic procedure(s) in the ambulatory ([facility]) setting.  2.  Dr. Elesa Hacker availability is not until April.  Patient wanted to wait for him. 3.  Patient to follow in clinic per recommendations after time of procedure.  Hyacinth Meeker, PA-C [facility] 10/04/2023, 9:45 AM  Cc: Chilton Greathouse, MD

## 2023-10-04 NOTE — Patient Instructions (Signed)
 You have been scheduled for a colonoscopy. Please follow written instructions given to you at your visit today.   [facility] has implemented a new process for scheduling procedures.  Please note your arrival time for the [facility] is the appointment time that is shown on your written instructions.  Please do not arrive one hour prior to the time listed in your instructions.  Please ignore any outside notifications to arrive one hour early.  We apologize for any confusion and look forward to seeing you for your procedure.   If you use inhalers (even only as needed), please bring them with you on the day of your procedure.  DO NOT TAKE 7 DAYS PRIOR TO TEST- Trulicity (dulaglutide) Ozempic, Wegovy (semaglutide) Mounjaro (tirzepatide) Bydureon Bcise (exanatide extended release)  DO NOT TAKE 1 DAY PRIOR TO YOUR TEST Rybelsus (semaglutide) Adlyxin (lixisenatide) Victoza (liraglutide) Byetta (exanatide)  _______________________________________________________  If your blood pressure at your visit was 140/90 or greater, please contact your primary care physician to follow up on this.  _______________________________________________________  If you are age 84 or older, your body mass index should be between 23-30. Your Body mass index is 27.02 kg/m. If this is out of the aforementioned range listed, please consider follow up with your Primary Care Provider.  If you are age 44 or younger, your body mass index should be between 19-25. Your Body mass index is 27.02 kg/m. If this is out of the aformentioned range listed, please consider follow up with your Primary Care Provider.   ________________________________________________________  The [facility] providers would like to encourage you to use [facility] to communicate with providers for non-urgent requests or questions.  Due to long hold times on the telephone, sending your provider a message by [facility] may be a faster and more  efficient way to get a response.  Please allow 48 business hours for a response.  Please remember that this is for non-urgent requests.  _______________________________________________________

## 2023-10-07 NOTE — Progress Notes (Signed)
 Attending Physician's Attestation   I have reviewed the chart.   I agree with the Advanced Practitioner's note, impression, and recommendations with any updates as below.    Corliss Parish, MD [facility] Advanced Endoscopy Office # [phone]

## 2023-10-23 ENCOUNTER — Encounter: Payer: Self-pay | Admitting: Gastroenterology

## 2023-10-28 ENCOUNTER — Encounter: Payer: Self-pay | Admitting: Certified Registered Nurse Anesthetist

## 2023-10-31 ENCOUNTER — Telehealth: Payer: Self-pay

## 2023-10-31 ENCOUNTER — Encounter: Payer: Self-pay | Admitting: Gastroenterology

## 2023-10-31 ENCOUNTER — Ambulatory Visit (AMBULATORY_SURGERY_CENTER): Admitting: Gastroenterology

## 2023-10-31 VITALS — BP 110/74 | HR 79 | Temp 97.4°F | Resp 11 | Ht 72.0 in | Wt 199.4 lb

## 2023-10-31 DIAGNOSIS — K5730 Diverticulosis of large intestine without perforation or abscess without bleeding: Secondary | ICD-10-CM | POA: Diagnosis present

## 2023-10-31 DIAGNOSIS — K639 Disease of intestine, unspecified: Secondary | ICD-10-CM

## 2023-10-31 DIAGNOSIS — K641 Second degree hemorrhoids: Secondary | ICD-10-CM | POA: Diagnosis not present

## 2023-10-31 DIAGNOSIS — R933 Abnormal findings on diagnostic imaging of other parts of digestive tract: Secondary | ICD-10-CM

## 2023-10-31 MED ORDER — SODIUM CHLORIDE 0.9 % IV SOLN: 500.0000 mL | Freq: Once | INTRAVENOUS | Status: DC

## 2023-10-31 NOTE — Patient Instructions (Addendum)
 High fiber diet. Use FiberCon 1-2 tablets PO daily. Continue present medications. Repeat colonoscopy in 10 years for screening purposes. Discussion with patient to consider repeat CTAP with IV/PO contrast to delineate if the lesion noted on CT is still present or not, as it is not visible endoscopically.  Dr. Elesa Hacker office nurse will call you to set that up  Please read over handouts provided   YOU HAD AN ENDOSCOPIC PROCEDURE TODAY AT THE [facility]:   Refer to the procedure report that was given to you for any specific questions about what was found during the examination.  If the procedure report does not answer your questions, please call your gastroenterologist to clarify.  If you requested that your care partner not be given the details of your procedure findings, then the procedure report has been included in a sealed envelope for you to review at your convenience later.  YOU SHOULD EXPECT: Some feelings of bloating in the abdomen. Passage of more gas than usual.  Walking can help get rid of the air that was put into your GI tract during the procedure and reduce the bloating. If you had a lower endoscopy (such as a colonoscopy or flexible sigmoidoscopy) you may notice spotting of blood in your stool or on the toilet paper. If you underwent a bowel prep for your procedure, you may not have a normal bowel movement for a few days.  Please Note:  You might notice some irritation and congestion in your nose or some drainage.  This is from the oxygen used during your procedure.  There is no need for concern and it should clear up in a day or so.  SYMPTOMS TO REPORT IMMEDIATELY:  Following lower endoscopy (colonoscopy or flexible sigmoidoscopy):  Excessive amounts of blood in the stool  Significant tenderness or worsening of abdominal pains  Swelling of the abdomen that is new, acute  Fever of 100F or higher  For urgent or emergent issues, a gastroenterologist can be  reached at any hour by calling [phone]. Do not use MyChart messaging for urgent concerns.    DIET:  We do recommend a small meal at first, but then you may proceed to your regular diet.  Drink plenty of fluids but you should avoid alcoholic beverages for 24 hours.  ACTIVITY:  You should plan to take it easy for the rest of today and you should NOT DRIVE or use heavy machinery until tomorrow (because of the sedation medicines used during the test).    FOLLOW UP: Our staff will call the number listed on your records the next business day following your procedure.  We will call around 7:15- 8:00 am to check on you and address any questions or concerns that you may have regarding the information given to you following your procedure. If we do not reach you, we will leave a message.      SIGNATURES/CONFIDENTIALITY: You and/or your care partner have signed paperwork which will be entered into your electronic medical record.  These signatures attest to the fact that that the information above on your After Visit Summary has been reviewed and is understood.  Full responsibility of the confidentiality of this discharge information lies with you and/or your care-partner.

## 2023-10-31 NOTE — Op Note (Signed)
 [facility] Patient Name: Harry Molina Procedure Date: 10/31/2023 9:51 AM MRN: [id number] Endoscopist: Corliss Parish , MD, [id number] Age: 53 Referring MD:  Date of Birth: 21-Jul-1971 Gender: Male Account #: [id number] Procedure:                Colonoscopy Indications:              Abnormal CT of the GI tract Medicines:                Monitored Anesthesia Care Procedure:                Pre-Anesthesia Assessment:                           - Prior to the procedure, a History and Physical                            was performed, and patient medications and                            allergies were reviewed. The patient's tolerance of                            previous anesthesia was also reviewed. The risks                            and benefits of the procedure and the sedation                            options and risks were discussed with the patient.                            All questions were answered, and informed consent                            was obtained. Prior Anticoagulants: The patient has                            taken no anticoagulant or antiplatelet agents. ASA                            Grade Assessment: II - A patient with mild systemic                            disease. After reviewing the risks and benefits,                            the patient was deemed in satisfactory condition to                            undergo the procedure.                           After obtaining informed consent, the colonoscope  was passed under direct vision. Throughout the                            procedure, the patient's blood pressure, pulse, and                            oxygen saturations were monitored continuously. The                            Olympus Scope SN: [id number] was introduced through                            the anus and advanced to the 5 cm into the ileum.                            The colonoscopy was  performed without difficulty.                            The patient tolerated the procedure. The quality of                            the bowel preparation was good. The terminal ileum,                            ileocecal valve, appendiceal orifice, and rectum                            were photographed. Scope In: 9:55:57 AM Scope Out: 10:07:23 AM Scope Withdrawal Time: 0 hours 9 minutes 19 seconds  Total Procedure Duration: 0 hours 11 minutes 26 seconds  Findings:                 The digital rectal exam findings include                            hemorrhoids. Pertinent negatives include no                            palpable rectal lesions.                           Multiple small-mouthed diverticula were found in                            the recto-sigmoid colon and sigmoid colon.                           Normal mucosa was found in the entire colon.                           Non-bleeding non-thrombosed internal hemorrhoids                            were found during retroflexion, during perianal  exam and during digital exam. The hemorrhoids were                            Grade II (internal hemorrhoids that prolapse but                            reduce spontaneously). Complications:            No immediate complications. Estimated Blood Loss:     Estimated blood loss: none. Impression:               - Hemorrhoids found on digital rectal exam.                           - Diverticulosis in the recto-sigmoid colon and in                            the sigmoid colon.                           - Normal mucosa in the entire examined colon.                           - Non-bleeding non-thrombosed internal hemorrhoids. Recommendation:           - The patient will be observed post-procedure,                            until all discharge criteria are met.                           - Discharge patient to home.                           - Patient has a contact  number available for                            emergencies. The signs and symptoms of potential                            delayed complications were discussed with the                            patient. Return to normal activities tomorrow.                            Written discharge instructions were provided to the                            patient.                           - High fiber diet.                           - Use FiberCon 1-2 tablets PO daily.                           -  Continue present medications.                           - Repeat colonoscopy in 10 years for screening                            purposes.                           - Discussion with patient to consider repeat CTAP                            with IV/PO contrast to delineate if the lesion                            noted on CT is still present or not, as it is not                            visible endoscopically.                           - The findings and recommendations were discussed                            with the patient.                           - The findings and recommendations were discussed                            with the patient's family. Corliss Parish, MD 10/31/2023 10:12:44 AM

## 2023-10-31 NOTE — Progress Notes (Signed)
 GASTROENTEROLOGY PROCEDURE H&P NOTE   Primary Care Physician: Chilton Greathouse, MD  HPI: Harry Molina is a 53 y.o. male who presents for Colonoscopy for followup of abnormal CT imaging of the region of the [facility] with prior history of diverticulitis.  Past Medical History:  Diagnosis Date   Allergy    mild    Celiac sprue    Resolved at age 55-13   Hyperlipidemia    Hypertension    Lactose intolerance    Mitral valve prolapse    Pyloric stenosis    Spontaneous pneumothorax    unsure side   Past Surgical History:  Procedure Laterality Date   BIOPSY STOMACH     LEFT HEART CATH AND CORONARY ANGIOGRAPHY N/A 09/21/2020   Procedure: LEFT HEART CATH AND CORONARY ANGIOGRAPHY;  Surgeon: Lyn Records, MD;  [facility]: [facility];  Service: Cardiovascular;  Laterality: N/A;   OPEN REDUCTION INTERNAL FIXATION (ORIF) PROXIMAL PHALANX Right 09/04/2015   Procedure: OPEN REDUCTION INTERNAL FIXATION (ORIF) RIGHT SMALL FINGER PROXIMAL PHALANX;  Surgeon: Dominica Severin, MD;  Location: [facility] OR;  Service: Orthopedics;  Laterality: Right;   pyloric stenosis     UPPER GASTROINTESTINAL ENDOSCOPY     as a child - small bowel bx for celiac    Current Outpatient Medications  Medication Sig Dispense Refill   loratadine (CLARITIN) 10 MG tablet Take 10 mg by mouth daily as needed.     Multiple Vitamin tablet Take 1 tablet by mouth daily.     olmesartan (BENICAR) 40 MG tablet Take 20 mg by mouth daily.      rosuvastatin (CRESTOR) 20 MG tablet Take 20 mg by mouth daily.     No current facility-administered medications for this visit.    Current Outpatient Medications:    loratadine (CLARITIN) 10 MG tablet, Take 10 mg by mouth daily as needed., Disp: , Rfl:    Multiple Vitamin tablet, Take 1 tablet by mouth daily., Disp: , Rfl:    olmesartan (BENICAR) 40 MG tablet, Take 20 mg by mouth daily. , Disp: , Rfl:    rosuvastatin (CRESTOR) 20 MG tablet, Take 20 mg by mouth daily., Disp: , Rfl:  No  Known Allergies Family History  Problem Relation Age of Onset   Heart disease Father    Hyperlipidemia Father    Heart attack Father    Skin cancer Mother    Esophageal cancer Maternal Grandfather    Colon cancer Neg Hx    Stomach cancer Neg Hx    Pancreatic cancer Neg Hx    Liver disease Neg Hx    Colon polyps Neg Hx    Rectal cancer Neg Hx    Inflammatory bowel disease Neg Hx    Social History   Socioeconomic History   Marital status: Married    Spouse name: Not on file   Number of children: 3   Years of education: College   Highest education level: Not on file  Occupational History   Occupation: [employer]   Tobacco Use   Smoking status: Never   Smokeless tobacco: Current    Types: Snuff   Tobacco comments:    states he doesn't use smokeless tobacco "regularly"  Vaping Use   Vaping status: Never Used  Substance and Sexual Activity   Alcohol use: Yes    Alcohol/week: 0.0 standard drinks of alcohol    Comment: social   Drug use: No   Sexual activity: Not Currently  Other Topics Concern   Not  on file  Social History Narrative   Drinks about 3-4 diet cokes a day    Social Drivers of Corporate investment banker Strain: Not on file  Food Insecurity: Not on file  Transportation Needs: Not on file  Physical Activity: Not on file  Stress: Not on file  Social Connections: Not on file  Intimate Partner Violence: Not on file    Physical Exam: There were no vitals filed for this visit. There is no height or weight on file to calculate BMI. GEN: NAD EYE: Sclerae anicteric ENT: MMM CV: Non-tachycardic GI: Soft, NT/ND NEURO:  Alert & Oriented x 3  Lab Results: No results for input(s): "WBC", "HGB", "HCT", "PLT" in the last 72 hours. BMET No results for input(s): "NA", "K", "CL", "CO2", "GLUCOSE", "BUN", "CREATININE", "CALCIUM" in the last 72 hours. LFT No results for input(s): "PROT", "ALBUMIN", "AST", "ALT", "ALKPHOS", "BILITOT", "BILIDIR", "IBILI" in the  last 72 hours. PT/INR No results for input(s): "LABPROT", "INR" in the last 72 hours.   Impression / Plan: This is a 53 y.o.male who presents for Colonoscopy for followup of abnormal CT imaging of the region of the [facility] with prior history of diverticulitis.  The risks and benefits of endoscopic evaluation/treatment were discussed with the patient and/or family; these include but are not limited to the risk of perforation, infection, bleeding, missed lesions, lack of diagnosis, severe illness requiring hospitalization, as well as anesthesia and sedation related illnesses.  The patient's history has been reviewed, patient examined, no change in status, and deemed stable for procedure.  The patient and/or family is agreeable to proceed.    Corliss Parish, MD [facility] Advanced Endoscopy Office # [phone]

## 2023-10-31 NOTE — Progress Notes (Signed)
 Pt's states no medical or surgical changes since previsit or office visit.

## 2023-10-31 NOTE — Telephone Encounter (Signed)
-----   Message from [facility] sent at 10/31/2023 11:48 AM EDT ----- Regarding: Followup Harry Molina, This patient needs a follow-up CT abdomen/pelvis (IV/oral contrast) in 2 to 3 months. Diagnosis sigmoid colon lesion. Thanks. GM

## 2023-10-31 NOTE — Telephone Encounter (Signed)
 Order for CT scan AP with IV/oral contrast has been placed. Patient will be contacted by [facility] to schedule CT scan for June 2025.

## 2023-10-31 NOTE — Progress Notes (Signed)
 Report given to PACU, vss

## 2023-11-01 ENCOUNTER — Telehealth: Payer: Self-pay

## 2023-11-01 NOTE — Telephone Encounter (Signed)
 No answer after follow up call. Voice message left.
